# Patient Record
Sex: Female | Born: 1990 | Race: White | Hispanic: No | Marital: Single | Smoking: Current every day smoker
Health system: Southern US, Community
[De-identification: ages and names within clinical notes are randomized; demographics above are authoritative.]

---

## 2011-06-14 ENCOUNTER — Emergency Department (HOSPITAL_COMMUNITY)
Admission: EM | Admit: 2011-06-14 | Discharge: 2011-06-14 | Disposition: A | Discharge status: Home / Self Care | Payer: BC Managed Care – PPO | Attending: Emergency Medicine | Admitting: Emergency Medicine

## 2011-06-14 ENCOUNTER — Emergency Department (HOSPITAL_COMMUNITY): Payer: BC Managed Care – PPO

## 2011-06-14 DIAGNOSIS — R1031 Right lower quadrant pain: Secondary | ICD-10-CM | POA: Insufficient documentation

## 2011-06-14 DIAGNOSIS — N949 Unspecified condition associated with female genital organs and menstrual cycle: Secondary | ICD-10-CM | POA: Insufficient documentation

## 2011-06-14 DIAGNOSIS — R10813 Right lower quadrant abdominal tenderness: Secondary | ICD-10-CM | POA: Insufficient documentation

## 2011-06-14 LAB — URINALYSIS, ROUTINE W REFLEX MICROSCOPIC
Nitrite: NEGATIVE
Specific Gravity, Urine: 1.019 (ref 1.005–1.030)
pH: 7 (ref 5.0–8.0)

## 2011-06-14 LAB — CBC
HCT: 44.2 % (ref 36.0–46.0)
MCH: 30.6 pg (ref 26.0–34.0)
MCHC: 35.1 g/dL (ref 30.0–36.0)
RDW: 14.2 % (ref 11.5–15.5)

## 2011-06-14 LAB — DIFFERENTIAL
Basophils Absolute: 0.1 10*3/uL (ref 0.0–0.1)
Basophils Relative: 1 % (ref 0–1)
Eosinophils Relative: 3 % (ref 0–5)
Monocytes Absolute: 0.7 10*3/uL (ref 0.1–1.0)

## 2011-06-14 LAB — URINE MICROSCOPIC-ADD ON

## 2011-06-14 LAB — POCT PREGNANCY, URINE: Preg Test, Ur: NEGATIVE

## 2011-06-14 LAB — POCT I-STAT, CHEM 8
Creatinine, Ser: 1 mg/dL (ref 0.50–1.10)
Glucose, Bld: 74 mg/dL (ref 70–99)
Hemoglobin: 16 g/dL — ABNORMAL HIGH (ref 12.0–15.0)
TCO2: 23 mmol/L (ref 0–100)

## 2011-06-14 LAB — PREGNANCY, URINE: Preg Test, Ur: NEGATIVE

## 2011-06-14 LAB — WET PREP, GENITAL: Yeast Wet Prep HPF POC: NONE SEEN

## 2011-06-15 LAB — URINE CULTURE: Culture  Setup Time: 201210052012

## 2011-06-15 LAB — GC/CHLAMYDIA PROBE AMP, GENITAL: GC Probe Amp, Genital: NEGATIVE

## 2013-04-16 ENCOUNTER — Encounter: Payer: Self-pay | Admitting: Emergency Medicine

## 2013-04-16 ENCOUNTER — Emergency Department (INDEPENDENT_AMBULATORY_CARE_PROVIDER_SITE_OTHER)
Admission: EM | Admit: 2013-04-16 | Discharge: 2013-04-16 | Disposition: A | Discharge status: Home / Self Care | Payer: Managed Care, Other (non HMO) | Source: Home / Self Care | Attending: Family Medicine | Admitting: Family Medicine

## 2013-04-16 DIAGNOSIS — R21 Rash and other nonspecific skin eruption: Secondary | ICD-10-CM

## 2013-04-16 LAB — POCT CBC W AUTO DIFF (K'VILLE URGENT CARE)

## 2013-04-16 LAB — ANTISTREPTOLYSIN O TITER: ASO: 145 IU/mL (ref <409)

## 2013-04-16 MED ORDER — PREDNISONE 20 MG PO TABS: 20.0000 mg | ORAL_TABLET | Freq: Two times a day (BID) | ORAL | Status: DC

## 2013-04-16 NOTE — ED Notes (Signed)
Red itchy rash x 1 month.  She did change laundry detergent, but has since changed to a hypoallergenic laundry detergent with no relief

## 2013-04-16 NOTE — ED Provider Notes (Signed)
CSN: [id number]     Arrival date & time 04/16/13  0912 History     First MD Initiated Contact with Patient 04/16/13 [phone]     Chief Complaint  Patient presents with  . Rash     HPI Comments: Patient complains of onset of a localized rash on her left arm about a month ago, followed by gradual development of rash on other extremities and trunk.  The rash is pruritic.  She feels well otherwise.  No fevers, chills, and sweats.  No insect or tick bites.  She recalls that she had a brief cold-like illness just prior to onset of the rash Family history of eczema in her mother.  Patient is a 22 y.o. female presenting with rash. The history is provided by the patient.  Rash Pain location:  Generalized Pain quality comment:  Itching Pain severity:  Mild Onset quality:  Gradual Duration:  4 weeks Timing:  Constant Progression:  Unchanged Chronicity:  New Relieved by:  Nothing Exacerbated by: showers. Ineffective treatments: Benadryl. Associated symptoms: no anorexia, no chills, no cough, no diarrhea, no fatigue, no fever, no nausea, no shortness of breath, no sore throat and no vomiting     History reviewed. No pertinent past medical history. History reviewed. No pertinent past surgical history. No family history on file. History  Substance Use Topics  . Smoking status: Current Every Day Smoker -- 0.50 packs/day for 5 years  . Smokeless tobacco: Not on file  . Alcohol Use: Yes   OB History   Grav Para Term Preterm Abortions TAB SAB Ect Mult Living                 Review of Systems  Constitutional: Negative for fever, chills and fatigue.  HENT: Negative for sore throat.   Respiratory: Negative for cough and shortness of breath.   Gastrointestinal: Negative for nausea, vomiting, diarrhea and anorexia.  Skin: Positive for rash.  All other systems reviewed and are negative.    Allergies  Review of patient's allergies indicates no known allergies.  Home Medications   Current  Outpatient Rx  Name  Route  Sig  Dispense  Refill  . predniSONE (DELTASONE) 20 MG tablet   Oral   Take 1 tablet (20 mg total) by mouth 2 (two) times daily.   10 tablet   0    BP 114/79  Pulse 70  Temp(Src) 97.8 F (36.6 C) (Oral)  Ht 5\' 2"  (1.575 m)  Wt 128 lb (58.06 kg)  BMI 23.41 kg/m2  SpO2 100%  LMP 04/05/2013 Physical Exam Nursing notes and Vital Signs reviewed. Appearance:  Patient appears healthy, stated age, and in no acute distress Eyes:  Pupils are equal, round, and reactive to light and accomodation.  Extraocular movement is intact.  Conjunctivae are not inflamed  Ears:  Canals normal.  Tympanic membranes normal.  Nose:  Normal turbinates.  No sinus tenderness.   Mouth:  No lesions Pharynx:  Normal Neck:  Supple.   No adenopathy Lungs:  Clear to auscultation.  Breath sounds are equal.  Heart:  Regular rate and rhythm without murmurs, rubs, or gallops.  Abdomen:  Nontender without masses or hepatosplenomegaly.  Bowel sounds are present.  No CVA or flank tenderness.  Extremities:  No edema.  No calf tenderness Skin:  Widely scattered small oval shaped erythematous macuopapular lesions (3 to 8mm dia), many following skin cleavage lines.  New lesions have a fine collar of scale.  Older and resolving lesions show slight  hyperpigmentation.  No definite herald patch.  No lesions on palms or plantar surfaces, and none on face/scalp.  ED Course   Procedures  none    1. Rash and nonspecific skin eruption; differential diagnosis includes atypical pityriasis rosea, nummular eczema, guttate psoriasis.     MDM  Prednisone Burst.  Will check ASO titer. Minimize baths/showers, and bathe in cool water.  Use a mild bath soap containing oil such as unscented Dove.  Apply a moisturizing cream or lotion immediately after bathing while still wet, then towel dry.  Try non-sedating antihistamine such as Allegra, Claritin, or Zyrtec for itching. Followup with dermatologist if not  improving two weeks.  Lattie Haw, MD 04/17/13 1139

## 2018-01-12 ENCOUNTER — Encounter

## 2018-01-12 ENCOUNTER — Ambulatory Visit: Payer: Managed Care, Other (non HMO) | Admitting: Nurse Practitioner

## 2018-01-26 ENCOUNTER — Other Ambulatory Visit: Payer: Self-pay

## 2018-01-26 ENCOUNTER — Emergency Department (HOSPITAL_COMMUNITY): Payer: PRIVATE HEALTH INSURANCE

## 2018-01-26 ENCOUNTER — Encounter (HOSPITAL_COMMUNITY): Payer: Self-pay | Admitting: Emergency Medicine

## 2018-01-26 ENCOUNTER — Emergency Department (HOSPITAL_COMMUNITY)
Admission: EM | Admit: 2018-01-26 | Discharge: 2018-01-26 | Disposition: A | Discharge status: Home / Self Care | Payer: PRIVATE HEALTH INSURANCE | Attending: Emergency Medicine | Admitting: Emergency Medicine

## 2018-01-26 DIAGNOSIS — M25512 Pain in left shoulder: Secondary | ICD-10-CM | POA: Insufficient documentation

## 2018-01-26 DIAGNOSIS — F17200 Nicotine dependence, unspecified, uncomplicated: Secondary | ICD-10-CM | POA: Insufficient documentation

## 2018-01-26 LAB — BASIC METABOLIC PANEL
Anion gap: 10 (ref 5–15)
BUN: 7 mg/dL (ref 6–20)
CALCIUM: 8.5 mg/dL — AB (ref 8.9–10.3)
CO2: 19 mmol/L — ABNORMAL LOW (ref 22–32)
CREATININE: 0.72 mg/dL (ref 0.44–1.00)
Chloride: 109 mmol/L (ref 101–111)
Glucose, Bld: 141 mg/dL — ABNORMAL HIGH (ref 65–99)
Potassium: 4.3 mmol/L (ref 3.5–5.1)
SODIUM: 138 mmol/L (ref 135–145)

## 2018-01-26 LAB — CBC
HCT: 39.9 % (ref 36.0–46.0)
Hemoglobin: 13.5 g/dL (ref 12.0–15.0)
MCH: 31.5 pg (ref 26.0–34.0)
MCHC: 33.8 g/dL (ref 30.0–36.0)
MCV: 93.2 fL (ref 78.0–100.0)
PLATELETS: 304 10*3/uL (ref 150–400)
RBC: 4.28 MIL/uL (ref 3.87–5.11)
RDW: 13.2 % (ref 11.5–15.5)
WBC: 10.1 10*3/uL (ref 4.0–10.5)

## 2018-01-26 LAB — I-STAT TROPONIN, ED: TROPONIN I, POC: 0 ng/mL (ref 0.00–0.08)

## 2018-01-26 LAB — I-STAT BETA HCG BLOOD, ED (MC, WL, AP ONLY)

## 2018-01-26 MED ORDER — PREDNISONE 10 MG PO TABS: 40.0000 mg | ORAL_TABLET | Freq: Every day | ORAL | 0 refills | Status: AC

## 2018-01-26 MED ORDER — METHOCARBAMOL 500 MG PO TABS: 500.0000 mg | ORAL_TABLET | Freq: Three times a day (TID) | ORAL | 0 refills | Status: AC | PRN

## 2018-01-26 NOTE — Discharge Instructions (Addendum)
Lab work, heart enzymes, chest x-ray, EKG all looked ok today.   I suspect your pain is from muscular or nerve cause.  Both are treated similarly.   We will treat your inflammation with the following medication regimen: Prednisone 40 mg daily x 3 days Methocarbamol (robaxin) 500 mg every 8 hours x 5 days Ibuprofen 600 mg + Acetaminophen 1000 mg every 8 hours for the next 5 days Heating pad as needed Over the counter lidocaine patches (salonpas) can be helpful  Avoid any exacerbating activities for the next 48 hours.  After 48 hours, start doing light back range of motion exercises and walking to avoid worsening back stiffness.   Return for chest pain or shortness of breath with exertion, fevers, swelling numbness or weakness to your extremities, rash.  Follow up with orthopedist for re-evaluation if symptoms do not improve in 3-5 days despite medications

## 2018-01-26 NOTE — ED Triage Notes (Signed)
Pt reports sudden, sharp, left arm and chest pain that awoke her from sleep. Pt states she has had some nausea, diaphoresis and sob, tearful in triage. Denies any known injuries.

## 2018-01-26 NOTE — ED Notes (Signed)
Got patient into a gown on the monitor patient is resting with call bell in reach 
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
## 2018-07-13 ENCOUNTER — Ambulatory Visit
Admission: RE | Admit: 2018-07-13 | Discharge: 2018-07-13 | Disposition: A | Discharge status: Home / Self Care | Payer: PRIVATE HEALTH INSURANCE | Source: Ambulatory Visit | Attending: Adult Health | Admitting: Adult Health

## 2018-07-13 ENCOUNTER — Ambulatory Visit: Payer: PRIVATE HEALTH INSURANCE | Admitting: Adult Health

## 2018-07-13 ENCOUNTER — Encounter: Payer: Self-pay | Admitting: Adult Health

## 2018-07-13 VITALS — BP 120/72 | HR 75 | Temp 98.6°F | Resp 16 | Ht 62.0 in | Wt 185.0 lb

## 2018-07-13 DIAGNOSIS — R42 Dizziness and giddiness: Secondary | ICD-10-CM | POA: Diagnosis not present

## 2018-07-13 DIAGNOSIS — S060X0A Concussion without loss of consciousness, initial encounter: Secondary | ICD-10-CM | POA: Diagnosis not present

## 2018-07-13 DIAGNOSIS — G44319 Acute post-traumatic headache, not intractable: Secondary | ICD-10-CM

## 2018-07-13 NOTE — Progress Notes (Signed)
[facility] [address] [place], [place] [address]  Internal MEDICINE  Office Visit Note  Patient Name: Meredith Cortez  [id number]  [id number]  Date of Service: 07/16/2018   Complaints/HPI Pt is here for establishment of PCP. Chief Complaint  Patient presents with  . Facial Pain    chopping wood yesterday and a piece came up and got hit in the face and since has has multiple symptoms.   . Dizziness  . Nausea   HPI Pt here for new patient visit.  She is a well appearing 27 yo female. She denies current medication or medical issues.  Today she is complaining of right sided headache, with dizziness and nausea intermittently.  She states while chopping wood last night, a piece flew up and hit her near her right temple.  She denies LOC, however it did make her stumble and she had to sit down to avoid passing out. She states her head began to hurt, and has not stopped since then.  She intermittently has nausea and dizziness.  She denies any changes in vision.  She reports she smokes 10 cigarettes a week, and uses a Jule intermittently.  She drinks alcohol socially, and denies illicit drug use.    Current Medication: No outpatient encounter medications on file as of 07/13/2018.   No facility-administered encounter medications on file as of 07/13/2018.     Surgical History: History reviewed. No pertinent surgical history.  Medical History: History reviewed. No pertinent past medical history.  Family History: Family History  Problem Relation Age of Onset  . Alcoholism Father     Social History   Socioeconomic History  . Marital status: Single    Spouse name: Not on file  . Number of children: Not on file  . Years of education: Not on file  . Highest education level: Not on file  Occupational History  . Not on file  Social Needs  . Financial resource strain: Not on file  . Food insecurity:    Worry: Not on file    Inability: Not on file  . Transportation needs:     Medical: Not on file    Non-medical: Not on file  Tobacco Use  . Smoking status: Current Every Day Smoker    Packs/day: 0.50    Years: 5.00    Pack years: 2.50    Types: Cigarettes, E-cigarettes  . Smokeless tobacco: Never Used  Substance and Sexual Activity  . Alcohol use: Yes    Comment: 2-3 x/week  . Drug use: Never  . Sexual activity: Not on file  Lifestyle  . Physical activity:    Days per week: Not on file    Minutes per session: Not on file  . Stress: Not on file  Relationships  . Social connections:    Talks on phone: Not on file    Gets together: Not on file    Attends religious service: Not on file    Active member of club or organization: Not on file    Attends meetings of clubs or organizations: Not on file    Relationship status: Not on file  . Intimate partner violence:    Fear of current or ex partner: Not on file    Emotionally abused: Not on file    Physically abused: Not on file    Forced sexual activity: Not on file  Other Topics Concern  . Not on file  Social History Narrative  . Not on file  Review of Systems  Constitutional: Positive for activity change and appetite change. Negative for chills, fatigue and unexpected weight change.  HENT: Negative for congestion, rhinorrhea, sneezing and sore throat.   Eyes: Negative for photophobia, pain and redness.  Respiratory: Negative for cough, chest tightness and shortness of breath.   Cardiovascular: Negative for chest pain and palpitations.  Gastrointestinal: Negative for abdominal pain, constipation, diarrhea, nausea and vomiting.  Endocrine: Negative.   Genitourinary: Negative for dysuria and frequency.  Musculoskeletal: Negative for arthralgias, back pain, joint swelling and neck pain.  Skin: Negative for rash.  Allergic/Immunologic: Negative.   Neurological: Positive for dizziness and headaches. Negative for tremors and numbness.  Hematological: Negative for adenopathy. Does not  bruise/bleed easily.  Psychiatric/Behavioral: Negative for behavioral problems and sleep disturbance. The patient is not nervous/anxious.     Vital Signs: BP 120/72 (BP Location: Left Arm, Patient Position: Sitting, Cuff Size: Normal)   Pulse 75   Temp 98.6 F (37 C) (Oral)   Resp 16   Ht 5\' 2"  (1.575 m)   Wt 185 lb (83.9 kg)   SpO2 99%   BMI 33.84 kg/m    Physical Exam  Constitutional: She is oriented to person, place, and time. She appears well-developed and well-nourished. No distress.  HENT:  Head: Normocephalic and atraumatic.  Mouth/Throat: Oropharynx is clear and moist. No oropharyngeal exudate.  Eyes: Pupils are equal, round, and reactive to light. EOM are normal.  Neck: Normal range of motion. Neck supple. No JVD present. No tracheal deviation present. No thyromegaly present.  Cardiovascular: Normal rate, regular rhythm and normal heart sounds. Exam reveals no gallop and no friction rub.  No murmur heard. Pulmonary/Chest: Effort normal and breath sounds normal. No respiratory distress. She has no wheezes. She has no rales. She exhibits no tenderness.  Abdominal: Soft. There is no tenderness. There is no guarding.  Musculoskeletal: Normal range of motion.  Lymphadenopathy:    She has no cervical adenopathy.  Neurological: She is alert and oriented to person, place, and time. No cranial nerve deficit.  Skin: Skin is warm and dry. She is not diaphoretic.  Psychiatric: She has a normal mood and affect. Her behavior is normal. Judgment and thought content normal.  Nursing note and vitals reviewed.   Assessment/Plan: 1. Concussion without loss of consciousness, initial encounter Patient likely has concussion due to hitting herself in the head with a piece of wood while chopping wood.  Encourage patient to rest avoid screen time.  Take ibuprofen or Tylenol around-the-clock to stay ahead of headache symptoms.  Stay out of work for the next 2 days.  2. Acute post-traumatic  headache, not intractable Head CT is negative patient has no overt hematoma fracture or trauma.  Most likely just concussion and will follow with symptoms of it. - CT Head Wo Contrast; Future  3. Dizziness Dizziness will likely improve with rest and avoiding screen time.  If dizziness does not improve in the next 4 hours return to clinic or go to emergency room.  General Counseling: Janaiyah verbalizes understanding of the findings of todays visit and agrees with plan of treatment. I have discussed any further diagnostic evaluation that may be needed or ordered today. We also reviewed her medications today. she has been encouraged to call the office with any questions or concerns that should arise related to todays visit.  Orders Placed This Encounter  Procedures  . CT Head Wo Contrast    No orders of the defined types were placed in  this encounter.   Time spent: 25 Minutes   This patient was seen by Blima Ledger AGNP-C in Collaboration with Dr Lyndon Code as a part of collaborative care agreement  Johnna Acosta AGNP-C Internal Medicine

## 2018-08-11 ENCOUNTER — Ambulatory Visit: Payer: Self-pay | Admitting: Adult Health

## 2018-08-19 ENCOUNTER — Encounter (HOSPITAL_COMMUNITY): Payer: Self-pay | Admitting: *Deleted

## 2018-08-19 ENCOUNTER — Emergency Department (HOSPITAL_COMMUNITY): Payer: Self-pay

## 2018-08-19 ENCOUNTER — Emergency Department (HOSPITAL_COMMUNITY)
Admission: EM | Admit: 2018-08-19 | Discharge: 2018-08-19 | Disposition: A | Discharge status: Home / Self Care | Payer: Self-pay | Attending: Emergency Medicine | Admitting: Emergency Medicine

## 2018-08-19 DIAGNOSIS — Y93E1 Activity, personal bathing and showering: Secondary | ICD-10-CM | POA: Insufficient documentation

## 2018-08-19 DIAGNOSIS — W182XXA Fall in (into) shower or empty bathtub, initial encounter: Secondary | ICD-10-CM | POA: Insufficient documentation

## 2018-08-19 DIAGNOSIS — Y999 Unspecified external cause status: Secondary | ICD-10-CM | POA: Insufficient documentation

## 2018-08-19 DIAGNOSIS — Y92002 Bathroom of unspecified non-institutional (private) residence single-family (private) house as the place of occurrence of the external cause: Secondary | ICD-10-CM | POA: Insufficient documentation

## 2018-08-19 DIAGNOSIS — W19XXXA Unspecified fall, initial encounter: Secondary | ICD-10-CM

## 2018-08-19 DIAGNOSIS — F17210 Nicotine dependence, cigarettes, uncomplicated: Secondary | ICD-10-CM | POA: Insufficient documentation

## 2018-08-19 DIAGNOSIS — S2232XA Fracture of one rib, left side, initial encounter for closed fracture: Secondary | ICD-10-CM | POA: Insufficient documentation

## 2018-08-19 DIAGNOSIS — R0602 Shortness of breath: Secondary | ICD-10-CM | POA: Insufficient documentation

## 2018-08-19 MED ORDER — OXYCODONE-ACETAMINOPHEN 5-325 MG PO TABS: 1.0000 | ORAL_TABLET | Freq: Four times a day (QID) | ORAL | 0 refills | Status: DC | PRN

## 2018-08-19 MED ORDER — OXYCODONE-ACETAMINOPHEN 5-325 MG PO TABS
1.0000 | ORAL_TABLET | Freq: Once | ORAL | Status: AC
  Administered 2018-08-19: 1 via ORAL
  Filled 2018-08-19: qty 1

## 2018-08-19 NOTE — Discharge Instructions (Addendum)
Please read attached information. If you experience any new or worsening signs or symptoms please return to the emergency room for evaluation. Please follow-up with your primary care provider or specialist as discussed. Please use medication prescribed only as directed and discontinue taking if you have any concerning signs or symptoms.   °

## 2018-08-19 NOTE — ED Triage Notes (Signed)
Pt in after fall last night in her shower, landed on her left side, c/o left rib pain, worse with movement

## 2018-08-19 NOTE — ED Provider Notes (Signed)
[facility] EMERGENCY DEPARTMENT Provider Note   CSN: [id number] Arrival date & time: 08/19/18  1017     History   Chief Complaint Chief Complaint  Patient presents with  . Fall    HPI Meredith Cortez is a 27 y.o. female.  HPI   27 year old female presents status post fall.  Patient notes she was in the shower yesterday when she slipped landing on the edge of the shower.  She notes striking the left lateral ribs.  Since that time she has had pain, pain with inspiration or movement.  Patient denies any fever, she notes shortness of breath with deep inspiration, none at rest.  No history of the same, denies any abdominal pain or other signs of trauma.  Patient denies any chronic health conditions, notes she is a smoker.  History reviewed. No pertinent past medical history.  There are no active problems to display for this patient.   History reviewed. No pertinent surgical history.   OB History   None      Home Medications    Prior to Admission medications   Medication Sig Start Date End Date Taking? Authorizing Provider  oxyCODONE-acetaminophen (PERCOCET/ROXICET) 5-325 MG tablet Take 1 tablet by mouth every 6 (six) hours as needed for severe pain. 08/19/18   Eyvonne Mechanic, PA-C    Family History Family History  Problem Relation Age of Onset  . Alcoholism Father     Social History Social History   Tobacco Use  . Smoking status: Current Every Day Smoker    Packs/day: 0.50    Years: 5.00    Pack years: 2.50    Types: Cigarettes, E-cigarettes  . Smokeless tobacco: Never Used  Substance Use Topics  . Alcohol use: Yes    Comment: 2-3 x/week  . Drug use: Never     Allergies   Patient has no known allergies.   Review of Systems Review of Systems  All other systems reviewed and are negative.    Physical Exam Updated Vital Signs BP 104/70 (BP Location: Right Arm)   Pulse 91   Temp 97.6 F (36.4 C) (Oral)   Resp 20   LMP  07/29/2018   SpO2 100%   Physical Exam  Constitutional: She is oriented to person, place, and time. She appears well-developed and well-nourished.  HENT:  Head: Normocephalic and atraumatic.  Eyes: Pupils are equal, round, and reactive to light. Conjunctivae are normal. Right eye exhibits no discharge. Left eye exhibits no discharge. No scleral icterus.  Neck: Normal range of motion. No JVD present. No tracheal deviation present.  Pulmonary/Chest: Effort normal. No stridor.  Chest and back atraumatic exquisite tenderness palpation of left lateral ribs with attention to the posterior chest wall-no midline cervical thoracic or lumbar spinal tenderness to palpation-lung expansion decreased secondary to pain, lung sounds clear  Abdominal:  Abdomen soft nontender, no bruising  Neurological: She is alert and oriented to person, place, and time. Coordination normal.  Psychiatric: She has a normal mood and affect. Her behavior is normal. Judgment and thought content normal.  Nursing note and vitals reviewed.    ED Treatments / Results  Labs (all labs ordered are listed, but only abnormal results are displayed) Labs Reviewed - No data to display  EKG None  Radiology Dg Ribs Unilateral W/chest Left  Result Date: 08/19/2018 CLINICAL DATA:  Left-sided chest pain and shortness of breath after fall today. EXAM: LEFT RIBS AND CHEST - 3+ VIEW COMPARISON:  Radiographs of Jan 26, 2018.  FINDINGS: Minimally displaced fracture is seen involving posterior portion of left tenth rib. There is no evidence of pneumothorax or pleural effusion. Both lungs are clear. Heart size and mediastinal contours are within normal limits. IMPRESSION: Minimally displaced left tenth rib fracture. No acute cardiopulmonary abnormality seen. Electronically Signed   By: Lupita RaiderJames  Green Jr, M.D.   On: 08/19/2018 10:50    Procedures Procedures (including critical care time)  Medications Ordered in ED Medications    oxyCODONE-acetaminophen (PERCOCET/ROXICET) 5-325 MG per tablet 1 tablet (has no administration in time range)     Initial Impression / Assessment and Plan / ED Course  I have reviewed the triage vital signs and the nursing notes.  Pertinent labs & imaging results that were available during my care of the patient were reviewed by me and considered in my medical decision making (see chart for details).     Labs:   Imaging:  Consults:  Therapeutics:  Discharge Meds:   Assessment/Plan: 27 year old female with isolated left 10th rib fracture.  No signs of respiratory distress, she is young healthy with no significant past medical history.  Patient given incentive spirometer, pain medication discharged with outpatient follow-up and strict return precautions.  She verbalized understanding and agreement to today's plan had no further questions or concerns.      Final Clinical Impressions(s) / ED Diagnoses   Final diagnoses:  Fall, initial encounter  Closed fracture of one rib of left side, initial encounter    ED Discharge Orders         Ordered    oxyCODONE-acetaminophen (PERCOCET/ROXICET) 5-325 MG tablet  Every 6 hours PRN     08/19/18 1115           Eyvonne MechanicHedges, Joshuan Bolander, PA-C 08/19/18 1116    Arby BarrettePfeiffer, Marcy, MD 08/19/18 1401

## 2019-04-09 ENCOUNTER — Ambulatory Visit: Payer: BC Managed Care – PPO | Admitting: Adult Health

## 2019-04-09 ENCOUNTER — Encounter: Payer: Self-pay | Admitting: Adult Health

## 2019-04-09 ENCOUNTER — Other Ambulatory Visit: Payer: Self-pay

## 2019-04-09 VITALS — Temp 97.6°F

## 2019-04-09 DIAGNOSIS — R1115 Cyclical vomiting syndrome unrelated to migraine: Secondary | ICD-10-CM

## 2019-04-09 DIAGNOSIS — R140 Abdominal distension (gaseous): Secondary | ICD-10-CM

## 2019-04-09 NOTE — Progress Notes (Signed)
[facility] [place], [place] [address]  Internal MEDICINE  Office Visit Note  Patient Name: Meredith Cortez  [id number]  [id number]  Date of Service: 04/09/2019  Chief Complaint  Patient presents with  . Telephone Screen  . Abdominal Pain    abdominal pain for years, after going to restroom having pain, nausea and vomitting, in the last month and half lost 8  pounds   . Telephone Assessment     HPI Pt is here for a sick visit. Pt reports she has been having abdominal pain for the better part of 2 years.  She has always dealt with it.  It is mostly in the morning.  She typically wakes up, has a bowel movement and then her pain begins.  She will then be nauseated and vomiting for about an hour. The nausea will last a few hours after the vomiting stops.  She was treated for probably diverticulitis recently, and states the symptoms improved some while on flagyl, however did not go away completely. Over the last week she reports the symptoms have increased exponentially. She reports she has drastically changed her diet, and has been eating bland foods, and healthy foods.  She reports she vomited whole food the day after, she consumed it.  She reports an 8 pound weight loss in the last month and a half. She reports having 4-5 bowel movements a day that are at times diarrhea.           Current Medication:  Outpatient Encounter Medications as of 04/09/2019  Medication Sig  . [DISCONTINUED] oxyCODONE-acetaminophen (PERCOCET/ROXICET) 5-325 MG tablet Take 1 tablet by mouth every 6 (six) hours as needed for severe pain. (Patient not taking: Reported on 04/09/2019)   No facility-administered encounter medications on file as of 04/09/2019.       Medical History: History reviewed. No pertinent past medical history.   Vital Signs: Temp 97.6 F (36.4 C)    Review of Systems  Constitutional: Negative for chills, fatigue and unexpected weight change.  HENT: Negative  for congestion, rhinorrhea, sneezing and sore throat.   Eyes: Negative for photophobia, pain and redness.  Respiratory: Negative for cough, chest tightness and shortness of breath.   Cardiovascular: Negative for chest pain and palpitations.  Gastrointestinal: Negative for abdominal pain, constipation, diarrhea, nausea and vomiting.  Endocrine: Negative.   Genitourinary: Negative for dysuria and frequency.  Musculoskeletal: Negative for arthralgias, back pain, joint swelling and neck pain.  Skin: Negative for rash.  Allergic/Immunologic: Negative.   Neurological: Negative for tremors and numbness.  Hematological: Negative for adenopathy. Does not bruise/bleed easily.  Psychiatric/Behavioral: Negative for behavioral problems and sleep disturbance. The patient is not nervous/anxious.     Physical Exam Vitals signs and nursing note reviewed.  Constitutional:      General: She is not in acute distress.    Appearance: She is well-developed. She is not diaphoretic.  HENT:     Head: Normocephalic and atraumatic.     Mouth/Throat:     Pharynx: No oropharyngeal exudate.  Eyes:     Pupils: Pupils are equal, round, and reactive to light.  Neck:     Musculoskeletal: Normal range of motion and neck supple.     Thyroid: No thyromegaly.     Vascular: No JVD.     Trachea: No tracheal deviation.  Cardiovascular:     Rate and Rhythm: Normal rate and regular rhythm.     Heart sounds: Normal heart sounds. No murmur. No friction rub.  No gallop.   Pulmonary:     Effort: Pulmonary effort is normal. No respiratory distress.     Breath sounds: Normal breath sounds. No wheezing or rales.  Chest:     Chest wall: No tenderness.  Abdominal:     Palpations: Abdomen is soft.     Tenderness: There is no abdominal tenderness. There is no guarding.  Musculoskeletal: Normal range of motion.  Lymphadenopathy:     Cervical: No cervical adenopathy.  Skin:    General: Skin is warm and dry.  Neurological:      Mental Status: She is alert and oriented to person, place, and time.     Cranial Nerves: No cranial nerve deficit.  Psychiatric:        Behavior: Behavior normal.        Thought Content: Thought content normal.        Judgment: Judgment normal.     Assessment/Plan: 1. Abdominal distension (gaseous) Will get CT of abdomen, as patients symptoms are becoming more severe.  - CT Abdomen Pelvis W Contrast; Future  2. Cyclic vomiting syndrome Will treat with reglan, and get CT to rule out organic cause.   General Counseling: Diannia verbalizes understanding of the findings of todays visit and agrees with plan of treatment. I have discussed any further diagnostic evaluation that may be needed or ordered today. We also reviewed her medications today. she has been encouraged to call the office with any questions or concerns that should arise related to todays visit.   No orders of the defined types were placed in this encounter.   No orders of the defined types were placed in this encounter.   Time spent: 15 Minutes  This patient was seen by Blima LedgerAdam Bronsyn Shappell AGNP-C in Collaboration with Dr Lyndon CodeFozia M Khan as a part of collaborative care agreement.  Johnna AcostaAdam J. Lasonia Casino AGNP-C Internal Medicine

## 2019-04-14 ENCOUNTER — Other Ambulatory Visit: Payer: Self-pay

## 2019-04-14 ENCOUNTER — Ambulatory Visit (HOSPITAL_COMMUNITY)
Admission: RE | Admit: 2019-04-14 | Discharge: 2019-04-14 | Disposition: A | Discharge status: Home / Self Care | Payer: BC Managed Care – PPO | Source: Ambulatory Visit | Attending: Adult Health | Admitting: Adult Health

## 2019-04-14 DIAGNOSIS — R140 Abdominal distension (gaseous): Secondary | ICD-10-CM | POA: Insufficient documentation

## 2019-04-14 MED ORDER — IOHEXOL 300 MG/ML  SOLN
100.0000 mL | Freq: Once | INTRAMUSCULAR | Status: AC | PRN
  Administered 2019-04-14: 16:00:00 100 mL via INTRAVENOUS

## 2019-04-15 ENCOUNTER — Encounter: Payer: Self-pay | Admitting: Gastroenterology

## 2019-04-15 ENCOUNTER — Other Ambulatory Visit: Payer: Self-pay | Admitting: Adult Health

## 2019-04-15 ENCOUNTER — Encounter: Payer: Self-pay | Admitting: Adult Health

## 2019-04-15 DIAGNOSIS — R1115 Cyclical vomiting syndrome unrelated to migraine: Secondary | ICD-10-CM

## 2019-04-15 MED ORDER — METOCLOPRAMIDE HCL 10 MG/10ML PO SOLN: 5.0000 mg | Freq: Three times a day (TID) | ORAL | 0 refills | Status: DC

## 2019-04-15 NOTE — Progress Notes (Signed)
Sent referral for GI, and sent RX for reglan to pharmacy.

## 2019-04-16 ENCOUNTER — Encounter: Payer: Self-pay | Admitting: Adult Health

## 2019-05-09 ENCOUNTER — Other Ambulatory Visit: Payer: Self-pay | Admitting: Adult Health

## 2019-05-09 MED ORDER — METOCLOPRAMIDE HCL 10 MG/10ML PO SOLN: 5.0000 mg | Freq: Three times a day (TID) | ORAL | 0 refills | Status: DC

## 2019-05-09 NOTE — Progress Notes (Signed)
Refilled patients Reglan, she has CT scan scheduled for sept 8th.

## 2019-05-18 ENCOUNTER — Ambulatory Visit: Payer: BC Managed Care – PPO | Admitting: Gastroenterology

## 2019-05-28 ENCOUNTER — Ambulatory Visit: Payer: BC Managed Care – PPO | Admitting: Gastroenterology

## 2019-06-22 ENCOUNTER — Other Ambulatory Visit: Payer: Self-pay | Admitting: Adult Health

## 2019-10-12 ENCOUNTER — Ambulatory Visit: Payer: Self-pay | Attending: Internal Medicine

## 2019-10-12 DIAGNOSIS — Z20822 Contact with and (suspected) exposure to covid-19: Secondary | ICD-10-CM | POA: Insufficient documentation

## 2019-10-13 LAB — NOVEL CORONAVIRUS, NAA: SARS-CoV-2, NAA: NOT DETECTED

## 2019-12-29 ENCOUNTER — Other Ambulatory Visit: Payer: Self-pay | Admitting: Adult Health

## 2019-12-29 MED ORDER — TRIAMCINOLONE ACETONIDE 0.1 % EX CREA: 1.0000 "application " | TOPICAL_CREAM | Freq: Two times a day (BID) | CUTANEOUS | 0 refills | Status: DC

## 2019-12-29 MED ORDER — PREDNISONE 10 MG PO TABS: ORAL_TABLET | ORAL | 0 refills | Status: DC

## 2019-12-29 NOTE — Progress Notes (Signed)
Patient has rash, sent kenalog and triamcinolone cream to pharmacy

## 2020-01-25 ENCOUNTER — Emergency Department (HOSPITAL_BASED_OUTPATIENT_CLINIC_OR_DEPARTMENT_OTHER): Payer: Self-pay

## 2020-01-25 ENCOUNTER — Encounter (HOSPITAL_BASED_OUTPATIENT_CLINIC_OR_DEPARTMENT_OTHER): Payer: Self-pay | Admitting: *Deleted

## 2020-01-25 ENCOUNTER — Emergency Department (HOSPITAL_BASED_OUTPATIENT_CLINIC_OR_DEPARTMENT_OTHER)
Admission: EM | Admit: 2020-01-25 | Discharge: 2020-01-25 | Disposition: A | Discharge status: Home / Self Care | Payer: Self-pay | Attending: Emergency Medicine | Admitting: Emergency Medicine

## 2020-01-25 ENCOUNTER — Other Ambulatory Visit: Payer: Self-pay

## 2020-01-25 DIAGNOSIS — Y9301 Activity, walking, marching and hiking: Secondary | ICD-10-CM | POA: Insufficient documentation

## 2020-01-25 DIAGNOSIS — Z79899 Other long term (current) drug therapy: Secondary | ICD-10-CM | POA: Insufficient documentation

## 2020-01-25 DIAGNOSIS — Y999 Unspecified external cause status: Secondary | ICD-10-CM | POA: Insufficient documentation

## 2020-01-25 DIAGNOSIS — F17210 Nicotine dependence, cigarettes, uncomplicated: Secondary | ICD-10-CM | POA: Insufficient documentation

## 2020-01-25 DIAGNOSIS — Y929 Unspecified place or not applicable: Secondary | ICD-10-CM | POA: Insufficient documentation

## 2020-01-25 DIAGNOSIS — W1842XA Slipping, tripping and stumbling without falling due to stepping into hole or opening, initial encounter: Secondary | ICD-10-CM | POA: Insufficient documentation

## 2020-01-25 DIAGNOSIS — S93401A Sprain of unspecified ligament of right ankle, initial encounter: Secondary | ICD-10-CM | POA: Insufficient documentation

## 2020-01-25 NOTE — Discharge Instructions (Signed)
Rest - please stay off ankle as much as possible until you can put weight on it without severe pain Ice - ice for 20 minutes at a time, several times a day Compression - wear brace to provide support Elevate - elevate ankle above level of heart Ibuprofen - take 600mg  with food. Take up to 3-4 times daily

## 2020-01-25 NOTE — ED Triage Notes (Signed)
She fell in a hole last night and twisted her right ankle.

## 2020-01-25 NOTE — ED Provider Notes (Signed)
[facility] EMERGENCY DEPARTMENT Provider Note   CSN: [id number] Arrival date & time: 01/25/20  1129     History Chief Complaint  Patient presents with  . Ankle Injury    Meredith Cortez is a 29 y.o. female who presents with a right ankle injury.  Patient states that she was in pain attention yesterday when she was walking and she stepped in a hole and twisted her ankle.  Since then she has been having pain over the lateral aspect of the ankle which is worse with weightbearing.  She is having difficulty walking secondary to pain.  She is been taking ibuprofen with mild relief.  She states that she played soccer in the past but never got checked out for any prior ankle injuries.  She denies weakness, numbness or tingling.  HPI     History reviewed. No pertinent past medical history.  There are no problems to display for this patient.   History reviewed. No pertinent surgical history.   OB History   No obstetric history on file.     Family History  Problem Relation Age of Onset  . Alcoholism Father     Social History   Tobacco Use  . Smoking status: Current Every Day Smoker    Packs/day: 0.50    Years: 5.00    Pack years: 2.50    Types: Cigarettes, E-cigarettes  . Smokeless tobacco: Never Used  Substance Use Topics  . Alcohol use: Yes    Comment: 2-3 x/week  . Drug use: Never    Home Medications Prior to Admission medications   Medication Sig Start Date End Date Taking? Authorizing Provider  metoCLOPramide (REGLAN) 10 MG/10ML SOLN Take 5 mLs (5 mg total) by mouth 3 (three) times daily before meals. 05/09/19   Johnna Acosta, NP  predniSONE (DELTASONE) 10 MG tablet Use per dose pack 12/29/19   Scarboro, Coralee North, NP  triamcinolone cream (KENALOG) 0.1 % Apply 1 application topically 2 (two) times daily. 12/29/19   Johnna Acosta, NP    Allergies    Patient has no known allergies.  Review of Systems   Review of Systems  Musculoskeletal: Positive for  arthralgias. Negative for joint swelling.  Skin: Negative for wound.  Neurological: Negative for weakness and numbness.    Physical Exam Updated Vital Signs BP 123/85 (BP Location: Right Arm)   Pulse 74   Temp 98.5 F (36.9 C) (Oral)   Resp 16   Ht 5\' 2"  (1.575 m)   Wt 76.2 kg   LMP 12/28/2019   SpO2 100%   BMI 30.73 kg/m   Physical Exam Vitals and nursing note reviewed.  Constitutional:      General: She is not in acute distress.    Appearance: She is well-developed.  HENT:     Head: Normocephalic and atraumatic.  Eyes:     General: No scleral icterus.       Right eye: No discharge.        Left eye: No discharge.     Conjunctiva/sclera: Conjunctivae normal.     Pupils: Pupils are equal, round, and reactive to light.  Cardiovascular:     Rate and Rhythm: Normal rate.  Pulmonary:     Effort: Pulmonary effort is normal. No respiratory distress.  Abdominal:     General: There is no distension.  Musculoskeletal:     Cervical back: Normal range of motion.     Comments: Right ankle: No obvious swelling, deformity, or warmth. Tenderness to  palpation over lateral ankle. Able to wiggle toes. No calf tenderness. Cap refill <2. N/V intact.   Skin:    General: Skin is warm and dry.  Neurological:     Mental Status: She is alert and oriented to person, place, and time.  Psychiatric:        Behavior: Behavior normal.     ED Results / Procedures / Treatments   Labs (all labs ordered are listed, but only abnormal results are displayed) Labs Reviewed - No data to display  EKG None  Radiology DG Ankle Complete Right  Result Date: 01/25/2020 CLINICAL DATA:  Pain following fall with twisting injury EXAM: RIGHT ANKLE - COMPLETE 3+ VIEW COMPARISON:  None. FINDINGS: Frontal, oblique, and lateral views were obtained. There is no acute fracture or joint effusion. A small focus of calcification anterior to the talus may represent residua of old trauma. No evident joint space  narrowing or erosion. There is a small inferior calcaneal spur. Ankle mortise appears intact. IMPRESSION: No acute fracture. Small focus of calcification dorsal to the talus may represent residua of old trauma. Ankle mortise appears intact. No joint space narrowing. Small inferior calcaneal spur noted. Electronically Signed   By: Lowella Grip III M.D.   On: 01/25/2020 12:10    Procedures Procedures (including critical care time)  Medications Ordered in ED Medications - No data to display  ED Course  I have reviewed the triage vital signs and the nursing notes.  Pertinent labs & imaging results that were available during my care of the patient were reviewed by me and considered in my medical decision making (see chart for details).  29 year old who presents with ankle injury after stepping in a hole. Exam is remarkable for tenderness but there is no significant swelling or deformity. Xrays are negative for acute bony pathology. She played soccer in the past and xray shows possible old injury of the talus. Will treat as ankle sprain. ASO brace and crutches given. RICE protocol discussed.  MDM Rules/Calculators/A&P                       Final Clinical Impression(s) / ED Diagnoses Final diagnoses:  Sprain of right ankle, unspecified ligament, initial encounter    Rx / DC Orders ED Discharge Orders    None       Recardo Evangelist, PA-C 01/25/20 1452    Varney Biles, MD 01/27/20 [phone]

## 2020-12-15 IMAGING — CR DG ANKLE COMPLETE 3+V*R*
3 series · 3 of 3 positions shown · non-contrast
Comparison: None.

CLINICAL DATA: Pain following fall with twisting injury

EXAM:
RIGHT ANKLE - COMPLETE 3+ VIEW

[t ankle joint ap right]
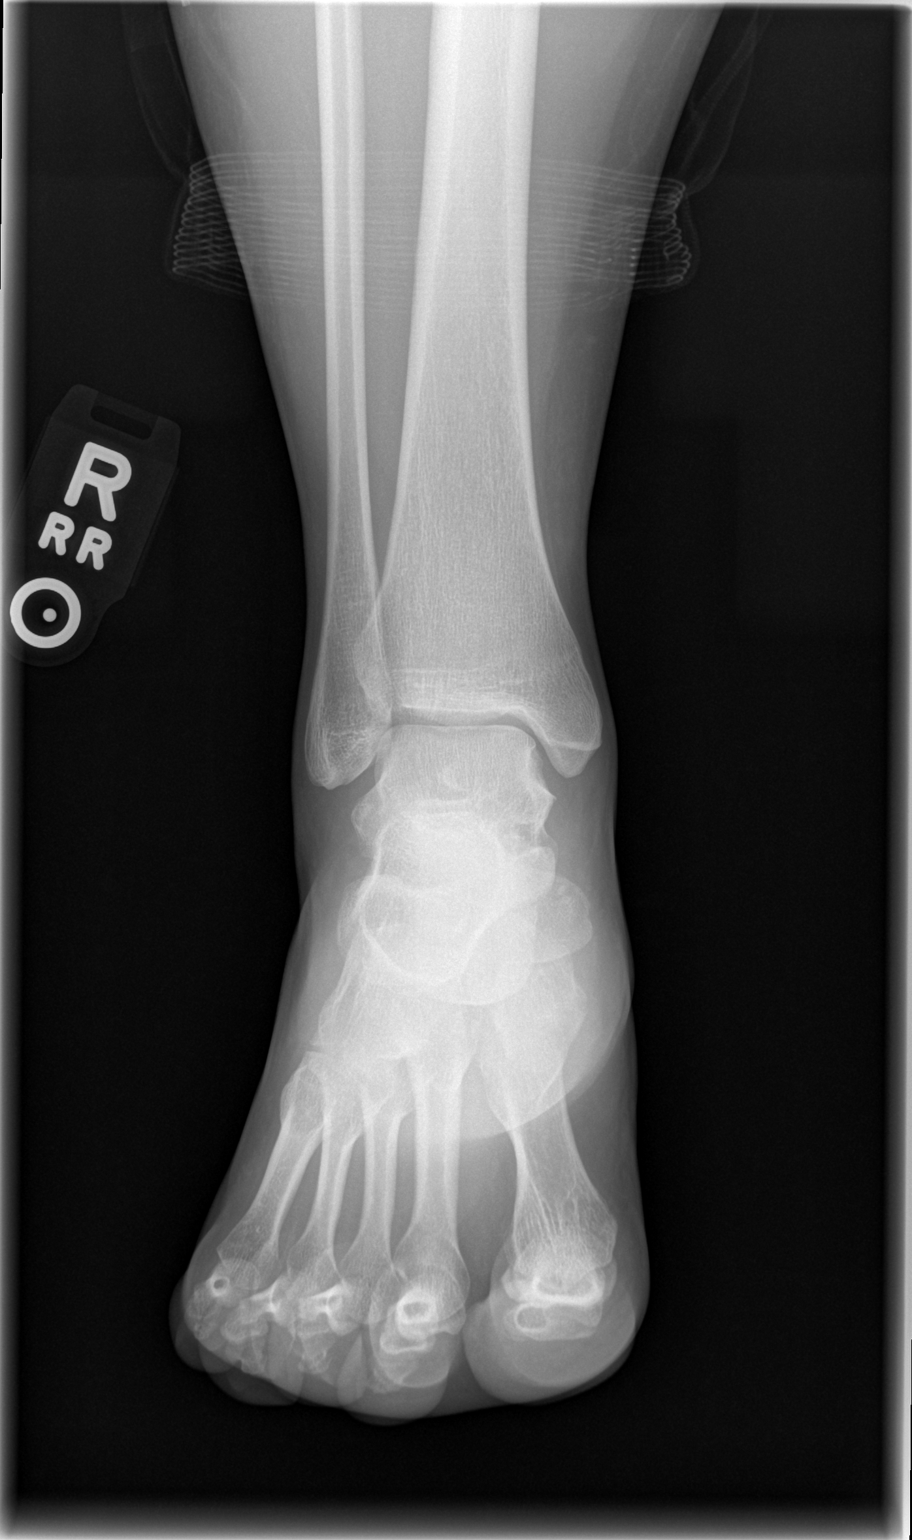

[t ankle joint oblique right]
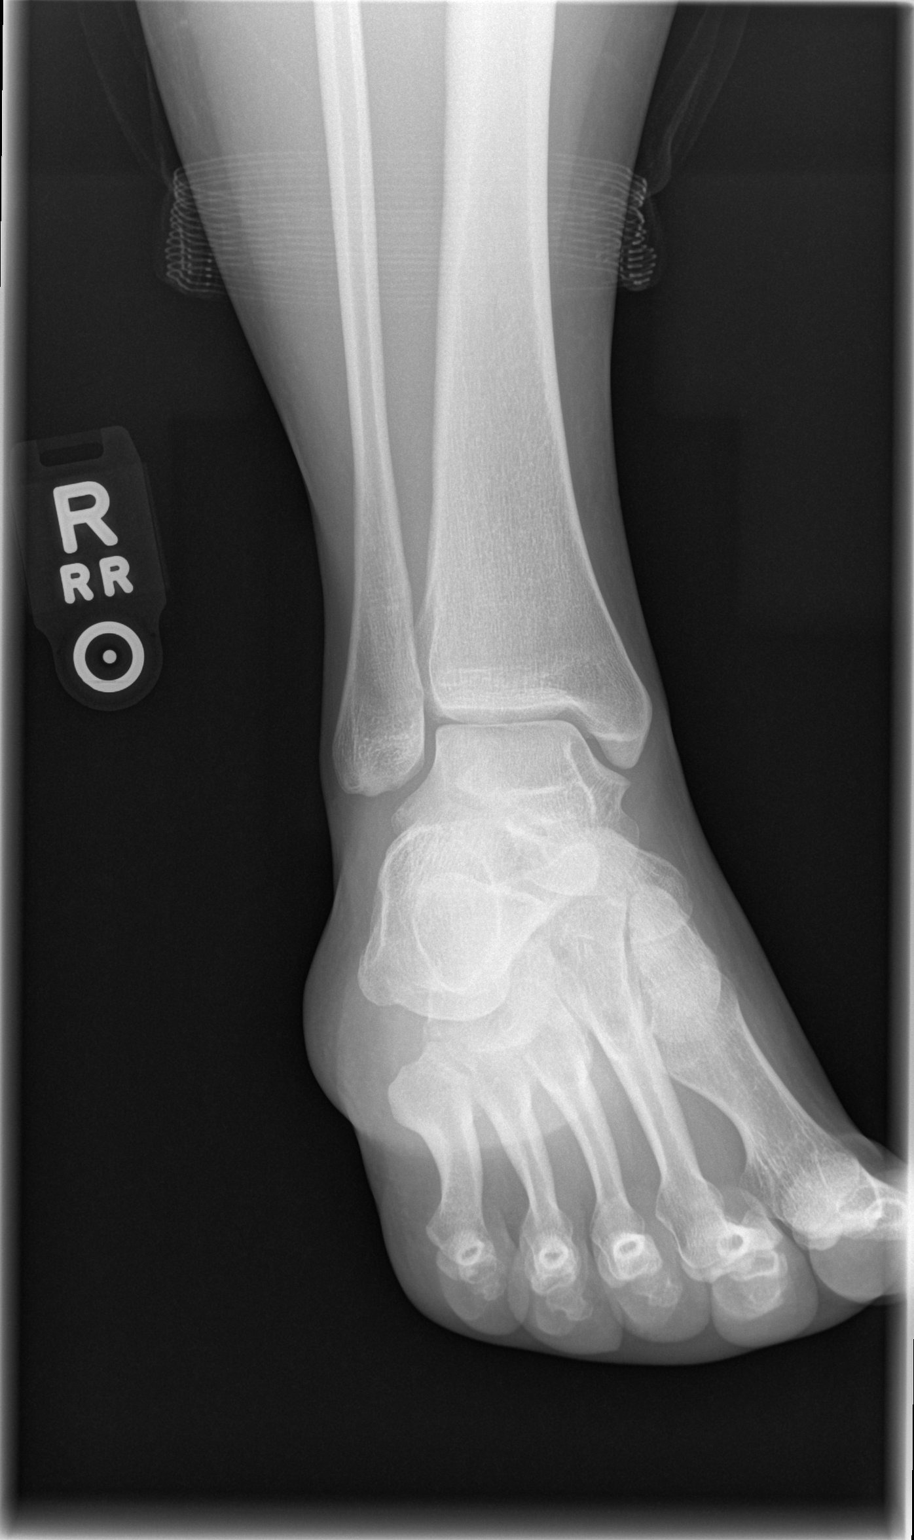

[t ankle joint lat right]
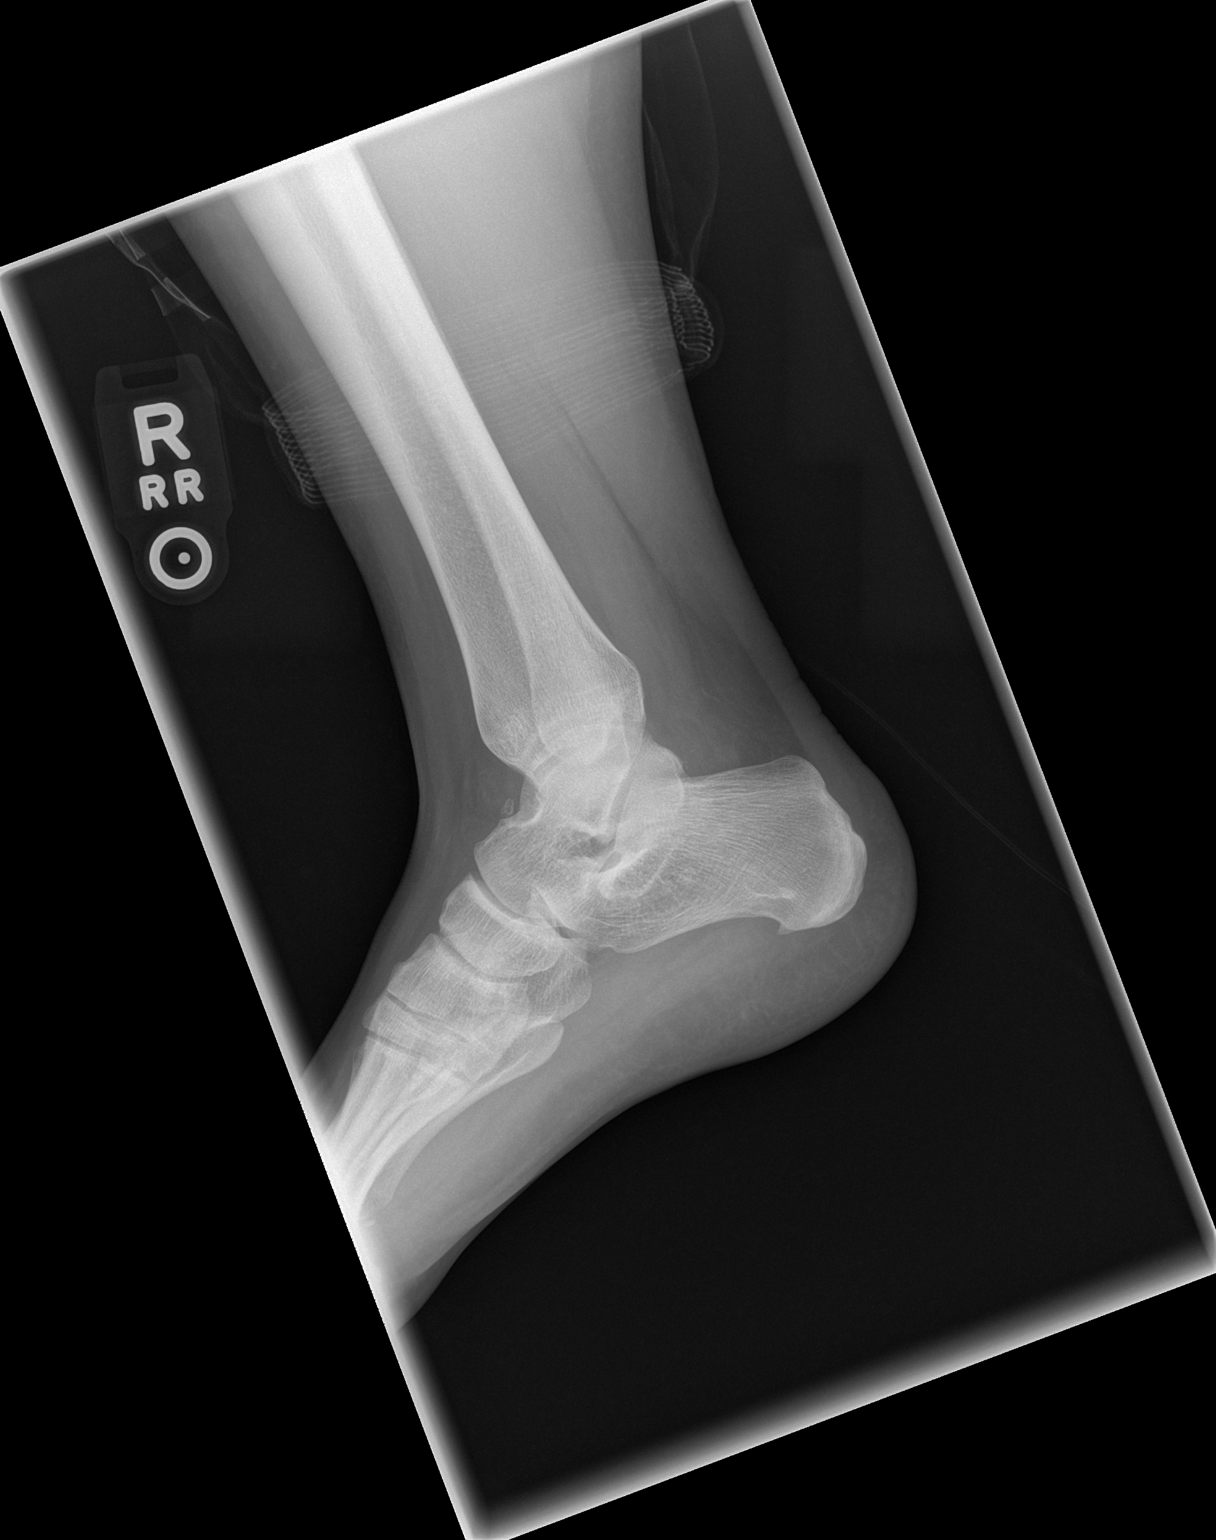

[3 of 3 positions shown; findings below may reference images not displayed]

FINDINGS: Frontal, oblique, and lateral views were obtained. There is no acute
fracture or joint effusion. A small focus of calcification anterior
to the talus may represent residua of old trauma. No evident joint
space narrowing or erosion. There is a small inferior calcaneal
spur. Ankle mortise appears intact.
IMPRESSION: No acute fracture. Small focus of calcification dorsal to the talus
may represent residua of old trauma. Ankle mortise appears intact.
No joint space narrowing. Small inferior calcaneal spur noted.

## 2021-04-04 ENCOUNTER — Emergency Department (HOSPITAL_COMMUNITY)
Admission: EM | Admit: 2021-04-04 | Discharge: 2021-04-04 | Disposition: A | Discharge status: Home / Self Care | Payer: Commercial Managed Care - PPO | Attending: Emergency Medicine | Admitting: Emergency Medicine

## 2021-04-04 ENCOUNTER — Other Ambulatory Visit: Payer: Self-pay

## 2021-04-04 ENCOUNTER — Encounter (HOSPITAL_COMMUNITY): Payer: Self-pay | Admitting: *Deleted

## 2021-04-04 ENCOUNTER — Emergency Department (HOSPITAL_COMMUNITY): Payer: Commercial Managed Care - PPO

## 2021-04-04 DIAGNOSIS — S93492A Sprain of other ligament of left ankle, initial encounter: Secondary | ICD-10-CM

## 2021-04-04 DIAGNOSIS — F17210 Nicotine dependence, cigarettes, uncomplicated: Secondary | ICD-10-CM | POA: Diagnosis not present

## 2021-04-04 DIAGNOSIS — W109XXA Fall (on) (from) unspecified stairs and steps, initial encounter: Secondary | ICD-10-CM | POA: Insufficient documentation

## 2021-04-04 DIAGNOSIS — Y9301 Activity, walking, marching and hiking: Secondary | ICD-10-CM | POA: Insufficient documentation

## 2021-04-04 DIAGNOSIS — S99912A Unspecified injury of left ankle, initial encounter: Secondary | ICD-10-CM | POA: Diagnosis present

## 2021-04-04 MED ORDER — CYCLOBENZAPRINE HCL 10 MG PO TABS: 10.0000 mg | ORAL_TABLET | Freq: Two times a day (BID) | ORAL | 0 refills | Status: DC | PRN

## 2021-04-04 MED ORDER — IBUPROFEN 800 MG PO TABS
800.0000 mg | ORAL_TABLET | Freq: Once | ORAL | Status: AC
  Administered 2021-04-04: 800 mg via ORAL
  Filled 2021-04-04: qty 1

## 2021-04-04 MED ORDER — IBUPROFEN 600 MG PO TABS: 600.0000 mg | ORAL_TABLET | Freq: Four times a day (QID) | ORAL | 0 refills | Status: DC | PRN

## 2021-04-04 NOTE — ED Provider Notes (Signed)
[facility]-EMERGENCY DEPT Provider Note   CSN: [id number] Arrival date & time: 04/04/21  8756     History Chief Complaint  Patient presents with   Ankle Pain    Meredith Cortez is a 30 y.o. female.  The history is provided by the patient. No language interpreter was used.  Ankle Pain Associated symptoms: no fever    30 year old female who presents complaining of left ankle injury.  Patient states last night she was walking down the steps but missed the last step, twisted her left ankle and fell.  She denies hitting her head or loss of consciousness.  She reported cute onset of sharp throbbing pain about the left ankle radiates towards her lower leg worse with ambulation.  Pain is moderate in intensity mildly improved with using ice, Biofreeze, and Tylenol.  No numbness no knee pain or hip pain.  No prior injury to the same ankle.  History reviewed. No pertinent past medical history.  There are no problems to display for this patient.   History reviewed. No pertinent surgical history.   OB History   No obstetric history on file.     Family History  Problem Relation Age of Onset   Alcoholism Father     Social History   Tobacco Use   Smoking status: Every Day    Packs/day: 0.50    Years: 5.00    Pack years: 2.50    Types: Cigarettes, E-cigarettes   Smokeless tobacco: Never  Vaping Use   Vaping Use: Some days  Substance Use Topics   Alcohol use: Yes    Comment: 2-3 x/week   Drug use: Never    Home Medications Prior to Admission medications   Medication Sig Start Date End Date Taking? Authorizing Provider  metoCLOPramide (REGLAN) 10 MG/10ML SOLN Take 5 mLs (5 mg total) by mouth 3 (three) times daily before meals. 05/09/19   Johnna Acosta, NP  predniSONE (DELTASONE) 10 MG tablet Use per dose pack 12/29/19   Scarboro, Coralee North, NP  triamcinolone cream (KENALOG) 0.1 % Apply 1 application topically 2 (two) times daily. 12/29/19   Johnna Acosta,  NP    Allergies    Patient has no known allergies.  Review of Systems   Review of Systems  Constitutional:  Negative for fever.  Musculoskeletal:  Positive for arthralgias.  Neurological:  Negative for numbness.   Physical Exam Updated Vital Signs BP (!) 132/95 (BP Location: Left Arm)   Pulse 87   Temp 98 F (36.7 C) (Oral)   Resp 16   LMP 03/21/2021   SpO2 100%   Physical Exam Vitals and nursing note reviewed.  Constitutional:      General: She is not in acute distress.    Appearance: She is well-developed.  HENT:     Head: Atraumatic.  Eyes:     Conjunctiva/sclera: Conjunctivae normal.  Pulmonary:     Effort: Pulmonary effort is normal.  Musculoskeletal:        General: Signs of injury (Left ankle: Moderate edema noted to the lateral malleoli region at the ATFL with tenderness to palpation but no crepitus.  Decreased ankle range of motion secondary to pain.  Intact dorsalis pedis pulse.  No tenderness to fifth metatarsal) present.     Cervical back: Neck supple.     Comments: Left knee and left hip nontender.  No tenderness to left calf  Skin:    Findings: No rash.  Neurological:     Mental Status:  She is alert.  Psychiatric:        Mood and Affect: Mood normal.    ED Results / Procedures / Treatments   Labs (all labs ordered are listed, but only abnormal results are displayed) Labs Reviewed - No data to display  EKG None  Radiology DG Ankle Complete Left  Result Date: 04/04/2021 CLINICAL DATA:  Larey Seat. Left ankle pain. EXAM: LEFT ANKLE COMPLETE - 3+ VIEW COMPARISON:  None. FINDINGS: The ankle mortise is maintained. No acute ankle fracture. No osteochondral lesion. No joint effusion. The mid and hindfoot bony structures are intact and the joint spaces are maintained. Moderate-sized calcaneal heel spur is noted. IMPRESSION: No acute bony findings. Electronically Signed   By: Rudie Meyer M.D.   On: 04/04/2021 09:54    Procedures Procedures   Medications  Ordered in ED Medications  ibuprofen (ADVIL) tablet 800 mg (has no administration in time range)    ED Course  I have reviewed the triage vital signs and the nursing notes.  Pertinent labs & imaging results that were available during my care of the patient were reviewed by me and considered in my medical decision making (see chart for details).    MDM Rules/Calculators/A&P                           BP (!) 132/95 (BP Location: Left Arm)   Pulse 87   Temp 98 F (36.7 C) (Oral)   Resp 16   LMP 03/21/2021   SpO2 100%   Final Clinical Impression(s) / ED Diagnoses Final diagnoses:  Sprain of anterior talofibular ligament of left ankle, initial encounter    Rx / DC Orders ED Discharge Orders          Ordered    ibuprofen (ADVIL) 600 MG tablet  Every 6 hours PRN        04/04/21 1017    cyclobenzaprine (FLEXERIL) 10 MG tablet  2 times daily PRN        04/04/21 1017           10:11 AM Patient presents with left ankle injury from a fall.  Finding consistent with an ankle sprain.  X-ray negative for fracture.  Will provide ASO for stability, RICE therapy discussed.  Orthopedic referral given as needed.  Work note provided.   Fayrene Helper, PA-C 04/04/21 1018    Benjiman Core, MD 04/04/21 [phone]

## 2021-04-04 NOTE — ED Triage Notes (Signed)
Pt complains of left ankle pain since falling down steps last night. No other injury. No loss of consciousness.

## 2021-04-04 NOTE — Progress Notes (Signed)
Orthopedic Tech Progress Note Patient Details:  Meredith Cortez July 01, 1991 [id number]  Ortho Devices Type of Ortho Device: ASO Ortho Device/Splint Location: left Ortho Device/Splint Interventions: Application   Post Interventions Patient Tolerated: Well Instructions Provided: Care of device, Adjustment of device  Saul Fordyce 04/04/2021, 10:43 AM

## 2022-01-01 DIAGNOSIS — H1033 Unspecified acute conjunctivitis, bilateral: Secondary | ICD-10-CM | POA: Insufficient documentation

## 2022-01-07 ENCOUNTER — Ambulatory Visit (HOSPITAL_COMMUNITY)
Admission: EM | Admit: 2022-01-07 | Discharge: 2022-01-07 | Disposition: A | Discharge status: Home / Self Care | Payer: Commercial Managed Care - PPO | Attending: Emergency Medicine | Admitting: Emergency Medicine

## 2022-01-07 ENCOUNTER — Other Ambulatory Visit: Payer: Self-pay

## 2022-01-07 ENCOUNTER — Encounter (HOSPITAL_COMMUNITY): Payer: Self-pay | Admitting: *Deleted

## 2022-01-07 DIAGNOSIS — H1033 Unspecified acute conjunctivitis, bilateral: Secondary | ICD-10-CM | POA: Diagnosis not present

## 2022-01-07 MED ORDER — FLUORESCEIN SODIUM 1 MG OP STRP
ORAL_STRIP | OPHTHALMIC | Status: AC
  Filled 2022-01-07: qty 1

## 2022-01-07 MED ORDER — GENTAMICIN SULFATE 0.3 % OP SOLN: 2.0000 [drp] | Freq: Three times a day (TID) | OPHTHALMIC | 0 refills | Status: AC

## 2022-01-07 MED ORDER — TETRACAINE HCL 0.5 % OP SOLN
OPHTHALMIC | Status: AC
  Filled 2022-01-07: qty 4

## 2022-01-07 NOTE — ED Triage Notes (Signed)
Had a virtual visit one week ago and was given eye drops . The eye drops have not helped the eye irritation. ?

## 2022-01-07 NOTE — ED Provider Notes (Signed)
?[facility] ? ? ? ?CSN: [id number] ?Arrival date & time: 01/07/22  1413 ? ? ?  ? ?History   ?Chief Complaint ?Chief Complaint  ?Patient presents with  ? Eye Problem  ? ? ?HPI ?Meredith Cortez is a 31 y.o. female.  ? ?She presents with two week history of eye redness, pain, and drainage. She was seen by video visit one week ago for the same and was prescribed polymyxin drops. After one week of using these as prescribed she had no improvement in symptoms. Today she began having blurry vision. She had purulent drainage from both eyes and crusting. She feels there is "grit" in her eyes. Her eye pain began giving her a headache. She has no known trauma to the eye. She does not wear contact lenses. Denies congestion, fever, sore throat, rash.  ? ? ?History reviewed. No pertinent past medical history. ? ?There are no problems to display for this patient. ? ?History reviewed. No pertinent surgical history. ? ?OB History   ?No obstetric history on file. ?  ? ?Home Medications   ? ?Prior to Admission medications   ?Medication Sig Start Date End Date Taking? Authorizing Provider  ?gentamicin (GARAMYCIN) 0.3 % ophthalmic solution Place 2 drops into both eyes 3 (three) times daily for 7 days. 01/07/22 01/14/22 Yes Azayla Polo, Wells Guiles, PA-C  ?cyclobenzaprine (FLEXERIL) 10 MG tablet Take 1 tablet (10 mg total) by mouth 2 (two) times daily as needed for muscle spasms. 04/04/21   Domenic Moras, PA-C  ?ibuprofen (ADVIL) 600 MG tablet Take 1 tablet (600 mg total) by mouth every 6 (six) hours as needed for moderate pain. 04/04/21   Domenic Moras, PA-C  ?metoCLOPramide (REGLAN) 10 MG/10ML SOLN Take 5 mLs (5 mg total) by mouth 3 (three) times daily before meals. 05/09/19   Kendell Bane, NP  ?predniSONE (DELTASONE) 10 MG tablet Use per dose pack 12/29/19   Scarboro, Audie Clear, NP  ?triamcinolone cream (KENALOG) 0.1 % Apply 1 application topically 2 (two) times daily. 12/29/19   Kendell Bane, NP  ? ? ?Family History ?Family History  ?Problem  Relation Age of Onset  ? Alcoholism Father   ? ? ?Social History ?Social History  ? ?Tobacco Use  ? Smoking status: Every Day  ?  Packs/day: 0.50  ?  Years: 5.00  ?  Pack years: 2.50  ?  Types: Cigarettes, E-cigarettes  ? Smokeless tobacco: Never  ?Vaping Use  ? Vaping Use: Some days  ?Substance Use Topics  ? Alcohol use: Yes  ?  Comment: 2-3 x/week  ? Drug use: Never  ? ?Allergies   ?Patient has no known allergies. ? ?Review of Systems ?Review of Systems  ?Constitutional:  Negative for fever.  ?HENT: Negative.    ?Eyes:  Positive for pain, discharge and redness.  ?Respiratory: Negative.    ?Cardiovascular: Negative.   ?Gastrointestinal: Negative.   ?Genitourinary: Negative.   ?Musculoskeletal: Negative.   ?Neurological: Negative.   ?All other systems reviewed and are negative. ? ?Physical Exam ?Triage Vital Signs ?ED Triage Vitals  ?Enc Vitals Group  ?   BP 01/07/22 1536 130/87  ?   Pulse Rate 01/07/22 1536 68  ?   Resp 01/07/22 1536 18  ?   Temp 01/07/22 1536 98.7 ?F (37.1 ?C)  ?   Temp src --   ?   SpO2 01/07/22 1536 97 %  ?   Weight --   ?   Height --   ?   Head Circumference --   ?  Peak Flow --   ?   Pain Score 01/07/22 1533 8  ?   Pain Loc --   ?   Pain Edu? --   ?   Excl. in [place]? --   ? ?No data found. ? ?Updated Vital Signs ?BP 130/87   Pulse 68   Temp 98.7 ?F (37.1 ?C)   Resp 18   LMP 01/01/2022   SpO2 97%  ? ? ?Physical Exam ?Eyes:  ?   General: Lids are normal.     ?   Right eye: Discharge present.     ?   Left eye: Discharge present. ?   Extraocular Movements: Extraocular movements intact.  ?   Conjunctiva/sclera:  ?   Right eye: Right conjunctiva is injected.  ?   Left eye: Left conjunctiva is injected.  ?   Pupils: Pupils are equal, round, and reactive to light.  ?   Comments: No corneal ulcer or abrasion noted on woods lamp evaluation ?Minimal discharge matted in lower eyelashes  ? ? ?UC Treatments / Results  ?Labs ?(all labs ordered are listed, but only abnormal results are displayed) ?Labs  Reviewed - No data to display ? ?EKG ? ?Radiology ?No results found. ? ?Procedures ?Procedures (including critical care time) ? ?Medications Ordered in UC ?Medications - No data to display ? ?Initial Impression / Assessment and Plan / UC Course  ?I have reviewed the triage vital signs and the nursing notes. ? ?Pertinent labs & imaging results that were available during my care of the patient were reviewed by me and considered in my medical decision making (see chart for details). ? ?Patient presenting with symptoms and exam consistent with bilateral conjunctivitis. Woods lamp evaluation ruled out corneal injury. Prescribed gentamycin drops for 7 days. We discussed to contact an eye doctor if symptoms do not improve with this new antibiotic. Contact information for eye doctor given. Discussed return precautions and patient agrees to plan. Discharged in stable condition. ? ?Final Clinical Impressions(s) / UC Diagnoses  ? ?Final diagnoses:  ?Acute conjunctivitis of both eyes, unspecified acute conjunctivitis type  ? ? ? ?Discharge Instructions   ? ?  ?Place two drops into each eye, three times a day, for a total of 7 days ? ?If no improvement, please call to make an appointment with an eye doctor. ? ?If symptoms worsen, please come back to urgent care or go to the emergency department.  ? ? ?ED Prescriptions   ? ? Medication Sig Dispense Auth. Provider  ? gentamicin (GARAMYCIN) 0.3 % ophthalmic solution Place 2 drops into both eyes 3 (three) times daily for 7 days. 5 mL Ilo Beamon, Wells Guiles, PA-C  ? ?  ? ?PDMP not reviewed this encounter. ?  ?Corbet Hanley, Wells Guiles, PA-C ?01/07/22 1912 ? ?

## 2022-01-07 NOTE — Discharge Instructions (Addendum)
Place two drops into each eye, three times a day, for a total of 7 days ? ?If no improvement, please call to make an appointment with an eye doctor. ? ?If symptoms worsen, please come back to urgent care or go to the emergency department.  ?

## 2022-02-18 ENCOUNTER — Telehealth: Payer: Commercial Managed Care - PPO | Admitting: Emergency Medicine

## 2022-02-18 DIAGNOSIS — H1013 Acute atopic conjunctivitis, bilateral: Secondary | ICD-10-CM | POA: Diagnosis not present

## 2022-02-18 MED ORDER — CROMOLYN SODIUM 4 % OP SOLN
1.0000 [drp] | Freq: Four times a day (QID) | OPHTHALMIC | 12 refills | Status: AC
Start: 2022-02-18 — End: ?

## 2022-02-18 NOTE — Progress Notes (Signed)
Virtual Visit Consent   Meredith Cortez, you are scheduled for a virtual visit with a [facility] provider today. Just as with appointments in the office, your consent must be obtained to participate. Your consent will be active for this visit and any virtual visit you may have with one of our providers in the next 365 days. If you have a MyChart account, a copy of this consent can be sent to you electronically.  As this is a virtual visit, video technology does not allow for your provider to perform a traditional examination. This may limit your provider's ability to fully assess your condition. If your provider identifies any concerns that need to be evaluated in person or the need to arrange testing (such as labs, EKG, etc.), we will make arrangements to do so. Although advances in technology are sophisticated, we cannot ensure that it will always work on either your end or our end. If the connection with a video visit is poor, the visit may have to be switched to a telephone visit. With either a video or telephone visit, we are not always able to ensure that we have a secure connection.  By engaging in this virtual visit, you consent to the provision of healthcare and authorize for your insurance to be billed (if applicable) for the services provided during this visit. Depending on your insurance coverage, you may receive a charge related to this service.  I need to obtain your verbal consent now. Are you willing to proceed with your visit today? Meredith Cortez has provided verbal consent on 02/18/2022 for a virtual visit (video or telephone). Carvel Getting, NP  Date: 02/18/2022 1:42 PM  Virtual Visit via Video Note   I, Carvel Getting, connected with  Meredith Cortez  ([id number], 1991-04-17) on 02/18/22 at  1:00 PM EDT by a video-enabled telemedicine application and verified that I am speaking with the correct person using two identifiers.  Location: Patient: Virtual Visit Location Patient:  Home Provider: Virtual Visit Location Provider: Home Office   I discussed the limitations of evaluation and management by telemedicine and the availability of in person appointments. The patient expressed understanding and agreed to proceed.    History of Present Illness: Meredith Cortez is a 31 y.o. who identifies as a female who was assigned female at birth, and is being seen today for pinkeye.  Patient reports she developed red eyes with drainage in April.  She had a video visit at that time and was prescribed Polytrim eyedrops.  After 5 days of use, she realized that they were not helping at all, and she went to urgent care where she was given a prescription for gentamicin eyedrops.  Symptoms did improve with gentamicin eyedrops and that the redness in her eyes was resolved.  However, even with the gentamicin eyedrops, her eyes continued to feel gritty and she continued to have a mucoid drainage every day.  Symptoms have been worse in the last 3 days as her eyes have become red again.  She typically does not have seasonal allergies and does not have any other symptoms of allergies such as runny nose.  Her eyes do feel itchy sometimes.  She does use warm compresses to help remove crusted drainage when she wakes up in the morning.  She does not wear contacts or use eye make-up.  No one else in her household including her girlfriend have similar symptoms.  She is the only 1 with the symptoms.  She denies fever or chills  or any other symptoms.  Symptoms have always been in both eyes; 1 eye has never been worse than the other.  HPI: HPI  Problems: There are no problems to display for this patient.   Allergies: No Known Allergies Medications:  Current Outpatient Medications:    cromolyn (OPTICROM) 4 % ophthalmic solution, Place 1 drop into the left eye 4 (four) times daily., Disp: 10 mL, Rfl: 12  Observations/Objective: Patient is well-developed, well-nourished in no acute distress.  Resting  comfortably  at home.  Head is normocephalic, atraumatic.  No labored breathing.  Speech is clear and coherent with logical content.  Patient is alert and oriented at baseline.  Sclera are mildly injected bilaterally.  I am not able to see any discharge on video.  Assessment and Plan: 1. Allergic conjunctivitis of both eyes  Prescribed cromolyn eyedrops.  Recommended artificial tears.  Recommended using Zyrtec or Claritin.  Patient to follow-up with an eye doctor if symptoms do not resolve.  Follow Up Instructions: I discussed the assessment and treatment plan with the patient. The patient was provided an opportunity to ask questions and all were answered. The patient agreed with the plan and demonstrated an understanding of the instructions.  A copy of instructions were sent to the patient via MyChart unless otherwise noted below.   The patient was advised to call back or seek an in-person evaluation if the symptoms worsen or if the condition fails to improve as anticipated.  Time:  I spent 15 minutes with the patient via telehealth technology discussing the above problems/concerns.    Carvel Getting, NP

## 2022-02-18 NOTE — Patient Instructions (Addendum)
  Meredith Cortez, thank you for joining Cathlyn Parsons, NP for today's virtual visit.  While this provider is not your primary care provider (PCP), if your PCP is located in our provider database this encounter information will be shared with them immediately following your visit.  Consent: (Patient) Meredith Cortez provided verbal consent for this virtual visit at the beginning of the encounter.  Current Medications:  Current Outpatient Medications:    cromolyn (OPTICROM) 4 % ophthalmic solution, Place 1 drop into the left eye 4 (four) times daily., Disp: 10 mL, Rfl: 12   Medications ordered in this encounter:  Meds ordered this encounter  Medications   cromolyn (OPTICROM) 4 % ophthalmic solution    Sig: Place 1 drop into the left eye 4 (four) times daily.    Dispense:  10 mL    Refill:  12     *If you need refills on other medications prior to your next appointment, please contact your pharmacy*  Follow-Up: Call back or seek an in-person evaluation if the symptoms worsen or if the condition fails to improve as anticipated.  Other Instructions I was able to prescribe only 1 medicine for your allergic eye symptoms - you do not need to prescription eyedrops at this time.  Try also taking medicine for allergies such as Zyrtec or Claritin to help manage your symptoms.   Use warm compresses using a clean washcloth each time to help soothe your eyes and remove drainage.  Purchase artificial tears to use to help soothe your eyes in between use of the prescription eyedrops.  Do not use the artificial tears at the same time as prescription eyedrops as the artificial tears will just wash the medicine out of your eye.  If this does not resolve your eye symptoms, you will need to be seen by an eye doctor such as an optometrist or ophthalmologist.   If you have been instructed to have an in-person evaluation today at a local Urgent Care facility, please use the link below. It will take you to a  list of all of our available [facility], including address, phone number and hours of operation. Please do not delay care.  [facility]  If you or a family member do not have a primary care provider, use the link below to schedule a visit and establish care. When you choose a [facility] primary care physician or advanced practice provider, you gain a long-term partner in health. Find a Primary Care Provider  Learn more about [facility]'s in-office and virtual care options: [facility] - Get Care Now

## 2022-02-23 IMAGING — CR DG ANKLE COMPLETE 3+V*L*
3 series · 3 of 3 positions shown · non-contrast
Comparison: None.

CLINICAL DATA: Fell. Left ankle pain.

EXAM:
LEFT ANKLE COMPLETE - 3+ VIEW

[x ankle ap left]
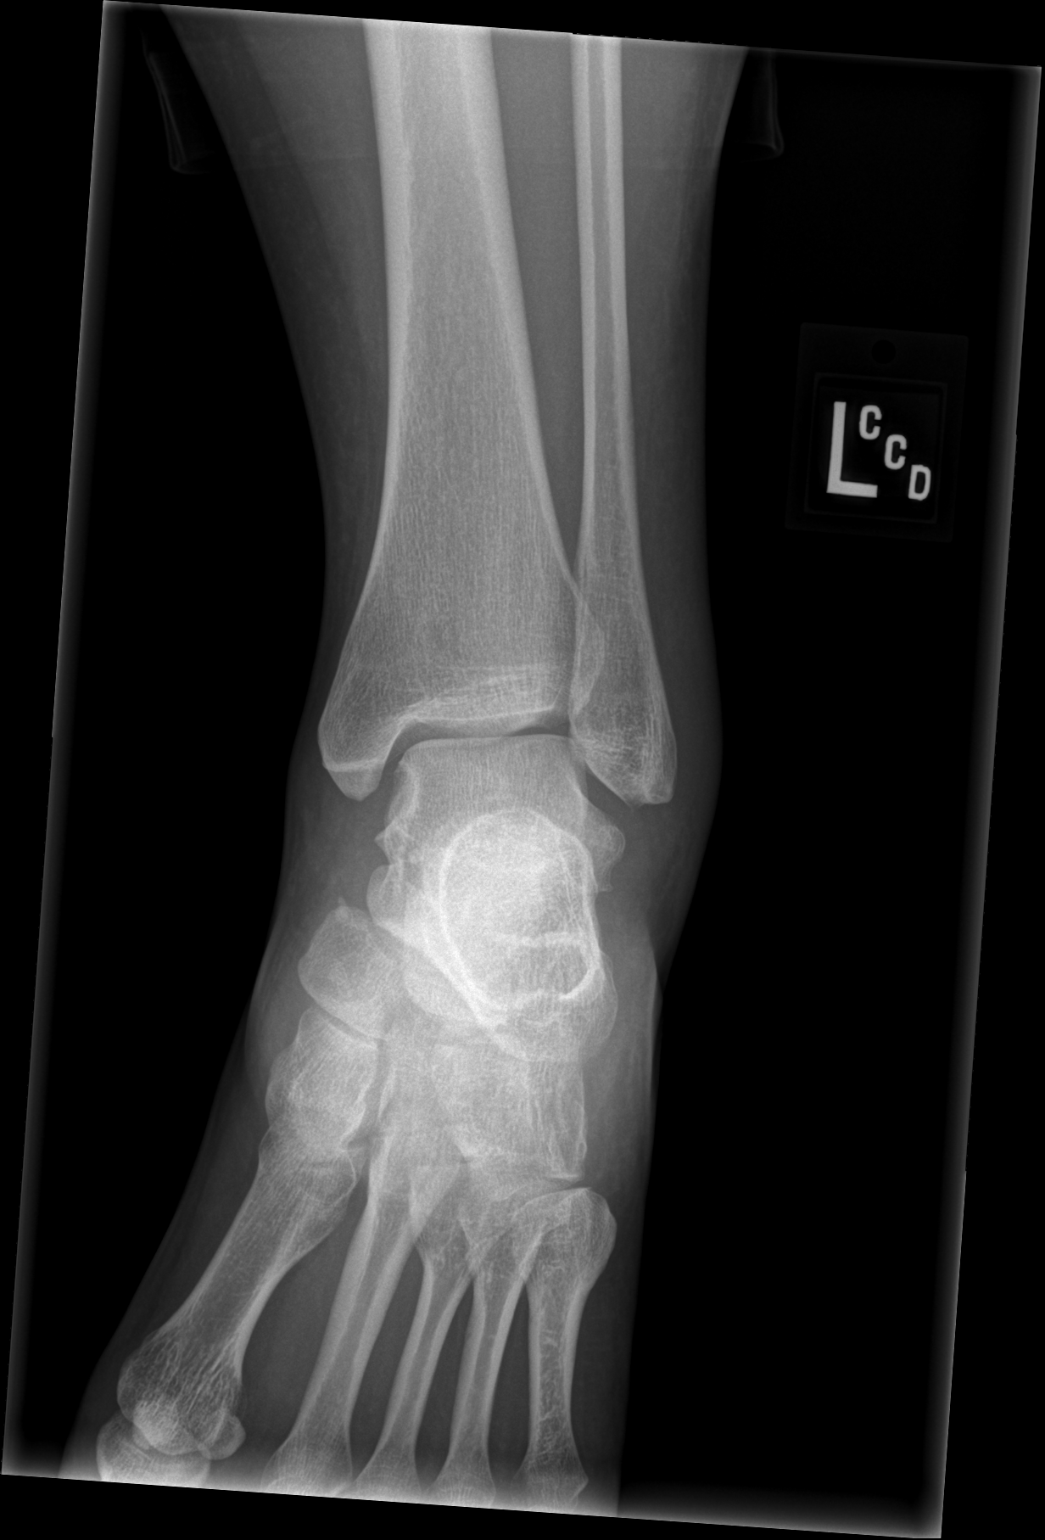

[x ankle obl left]
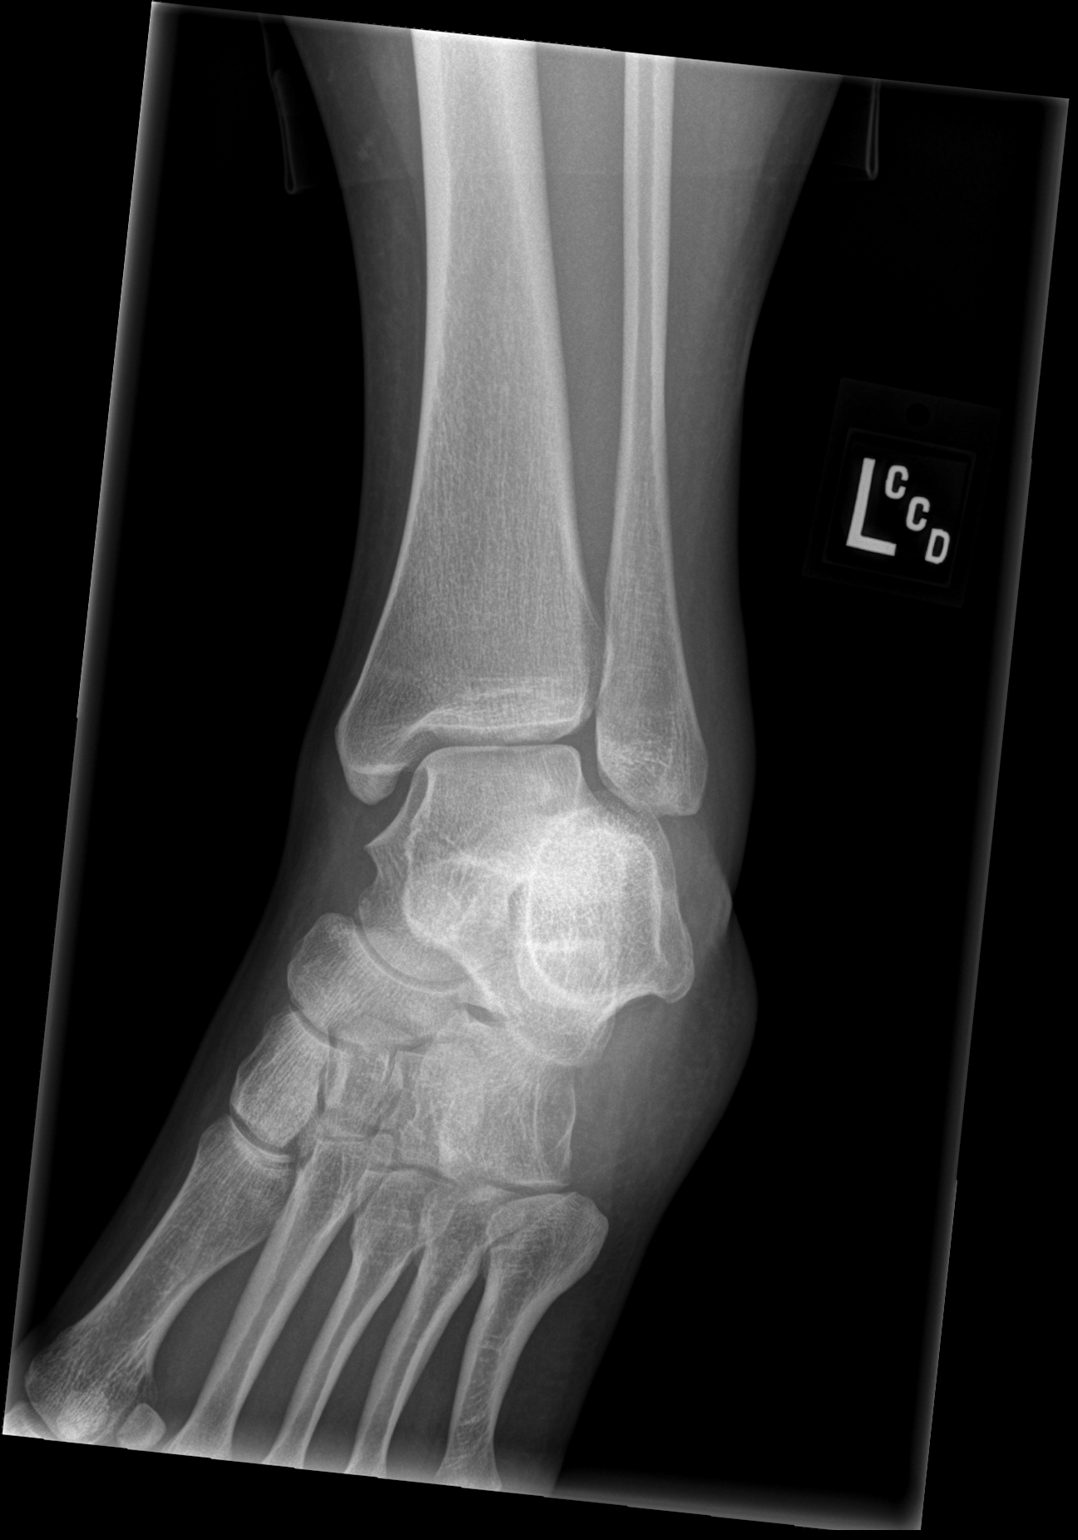

[x ankle lat left]
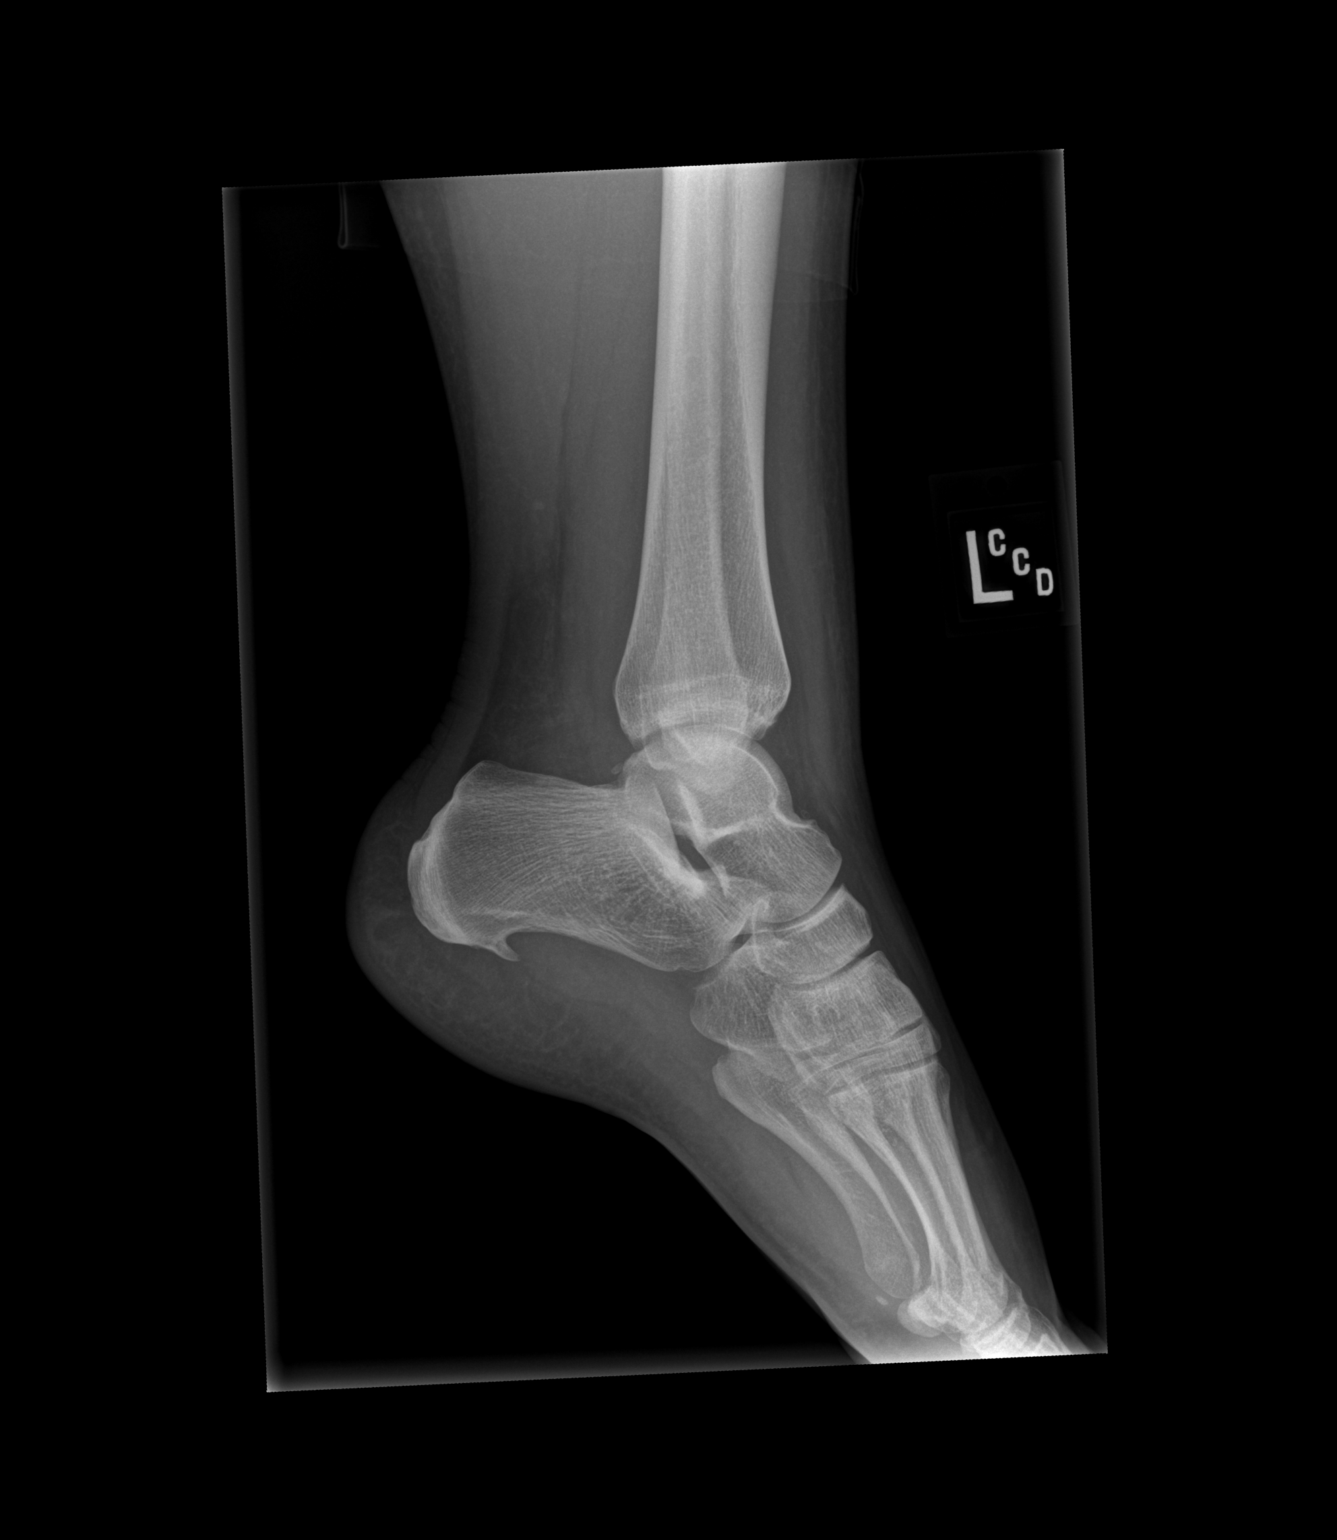

[3 of 3 positions shown; findings below may reference images not displayed]

FINDINGS: The ankle mortise is maintained. No acute ankle fracture. No
osteochondral lesion. No joint effusion.

The mid and hindfoot bony structures are intact and the joint spaces
are maintained.

Moderate-sized calcaneal heel spur is noted.
IMPRESSION: No acute bony findings.

## 2022-03-20 ENCOUNTER — Emergency Department (HOSPITAL_COMMUNITY)
Admission: EM | Admit: 2022-03-20 | Discharge: 2022-03-21 | Disposition: A | Discharge status: Home / Self Care | Payer: Commercial Managed Care - PPO | Source: Home / Self Care | Attending: Emergency Medicine | Admitting: Emergency Medicine

## 2022-03-20 ENCOUNTER — Encounter (HOSPITAL_COMMUNITY): Payer: Self-pay

## 2022-03-20 ENCOUNTER — Emergency Department (HOSPITAL_COMMUNITY)
Admission: EM | Admit: 2022-03-20 | Discharge: 2022-03-20 | Discharge status: Against Medical Advice | Payer: Commercial Managed Care - PPO | Attending: Emergency Medicine | Admitting: Emergency Medicine

## 2022-03-20 ENCOUNTER — Other Ambulatory Visit: Payer: Self-pay

## 2022-03-20 DIAGNOSIS — F17210 Nicotine dependence, cigarettes, uncomplicated: Secondary | ICD-10-CM | POA: Insufficient documentation

## 2022-03-20 DIAGNOSIS — H1013 Acute atopic conjunctivitis, bilateral: Secondary | ICD-10-CM | POA: Insufficient documentation

## 2022-03-20 DIAGNOSIS — R519 Headache, unspecified: Secondary | ICD-10-CM | POA: Diagnosis not present

## 2022-03-20 DIAGNOSIS — H538 Other visual disturbances: Secondary | ICD-10-CM | POA: Diagnosis present

## 2022-03-20 DIAGNOSIS — Z5321 Procedure and treatment not carried out due to patient leaving prior to being seen by health care provider: Secondary | ICD-10-CM | POA: Insufficient documentation

## 2022-03-20 NOTE — ED Triage Notes (Signed)
Pt presents to ED from home. States her eyes have been swollen, irritated, and red since April. Pt states she has been worked up for multiple causes and has taken abx for treatment but problem has not resolved. Pt endorses headache.

## 2022-03-20 NOTE — ED Triage Notes (Signed)
Patients eyes have been red/inflamed since April. She went to the eye doctor, they keep placing her on different eye drops and antibiotics. Her eyes are crusted over when she wakes up. Continuous headache. Patients vision is starting to get blurry.

## 2022-03-21 ENCOUNTER — Telehealth: Payer: Self-pay | Admitting: Adult Health

## 2022-03-21 MED ORDER — FLUORESCEIN SODIUM 1 MG OP STRP
1.0000 | ORAL_STRIP | Freq: Once | OPHTHALMIC | Status: AC
Start: 2022-03-21 — End: 2022-03-21
  Administered 2022-03-21: 1 via OPHTHALMIC
  Filled 2022-03-21: qty 1

## 2022-03-21 MED ORDER — OLOPATADINE HCL 0.2 % OP SOLN: 1.0000 [drp] | Freq: Every day | OPHTHALMIC | 0 refills | Status: AC

## 2022-03-21 MED ORDER — TETRACAINE HCL 0.5 % OP SOLN
2.0000 [drp] | Freq: Once | OPHTHALMIC | Status: AC
Start: 2022-03-21 — End: 2022-03-21
  Administered 2022-03-21: 2 [drp] via OPHTHALMIC
  Filled 2022-03-21: qty 4

## 2022-03-21 NOTE — Discharge Instructions (Addendum)
You were evaluated in the Emergency Department and after careful evaluation, we did not find any emergent condition requiring admission or further testing in the hospital.  Your exam/testing today is overall reassuring.  Recommend continued follow-up with an ophthalmologist.  Use the eyedrops as directed to try to treat for allergic conjunctivitis.  Please return to the Emergency Department if you experience any worsening of your condition.   Thank you for allowing [place] to be a part of your care.

## 2022-03-21 NOTE — Telephone Encounter (Signed)
Ins removed from visit - Estimate created and provided verbally to patient.

## 2022-03-21 NOTE — Telephone Encounter (Signed)
FYI-- Patient called to confirm new patient appointment information. States that she is not sure if she is still covered under [employer] due to losing her job last week. I informed patient she should call [employer] first to see how long she would be covered and then call us back for the next steps. Pt verbalized understanding and appreciated the advice. I told patient that when she calls back she may talk to me directly if she chooses.

## 2022-03-21 NOTE — ED Provider Notes (Signed)
[facility]-EMERGENCY DEPT [facility] Emergency Department Provider Note MRN:  [id number]  Arrival date & time: 03/21/22     Chief Complaint   Eye Problem   History of Present Illness   Meredith Cortez is a 31 y.o. year-old female with no pertinent past medical presenting to the ED with chief complaint of eye problem.  Bilateral redness to the eyes with drainage and crusting for the past 3 months.  Has been on multiple antibiotic eyedrops without help.  Does not know what else to do.  Saw an ophthalmologist last week who had her start a new antibiotic medicine, which is not helping.  Mild blurred vision, otherwise no complaints.  No joint pain, no recent infections, no issues with urinating, no rash anywhere else.  Review of Systems  A thorough review of systems was obtained and all systems are negative except as noted in the HPI and PMH.   Patient's Health History   History reviewed. No pertinent past medical history.  History reviewed. No pertinent surgical history.  Family History  Problem Relation Age of Onset   Alcoholism Father     Social History   Socioeconomic History   Marital status: Single    Spouse name: Not on file   Number of children: Not on file   Years of education: Not on file   Highest education level: Not on file  Occupational History   Not on file  Tobacco Use   Smoking status: Every Day    Packs/day: 0.50    Years: 5.00    Total pack years: 2.50    Types: Cigarettes, E-cigarettes   Smokeless tobacco: Never  Vaping Use   Vaping Use: Some days  Substance and Sexual Activity   Alcohol use: Yes    Comment: 2-3 x/week   Drug use: Never   Sexual activity: Not on file  Other Topics Concern   Not on file  Social History Narrative   Not on file   Social Determinants of Health   Financial Resource Strain: Not on file  Food Insecurity: Not on file  Transportation Needs: Not on file  Physical Activity: Not on file  Stress: Not on file  Social  Connections: Not on file  Intimate Partner Violence: Not on file     Physical Exam   Vitals:   03/21/22 0230 03/21/22 0255  BP: 119/80 (!) 111/94  Pulse: 69 73  Resp:  16  Temp:    SpO2: 98% 100%    CONSTITUTIONAL: Well-appearing, NAD NEURO/PSYCH:  Alert and oriented x 3, no focal deficits EYES:  eyes equal and reactive ENT/NECK:  no LAD, no JVD CARDIO: Regular rate, well-perfused, normal S1 and S2 PULM:  CTAB no wheezing or rhonchi GI/GU:  non-distended, non-tender MSK/SPINE:  No gross deformities, no edema SKIN:  no rash, atraumatic   *Additional and/or pertinent findings included in MDM below  Diagnostic and Interventional Summary    EKG Interpretation  Date/Time:    Ventricular Rate:    PR Interval:    QRS Duration:   QT Interval:    QTC Calculation:   R Axis:     Text Interpretation:         Labs Reviewed - No data to display  No orders to display    Medications  fluorescein ophthalmic strip 1 strip (1 strip Both Eyes Given 03/21/22 0239)  tetracaine (PONTOCAINE) 0.5 % ophthalmic solution 2 drop (2 drops Both Eyes Given 03/21/22 0239)     Procedures  /  Critical  Care Procedures  ED Course and Medical Decision Making  Initial Impression and Ddx Chronic conjunctivitis bilaterally, serous drainage.  No concomitant symptoms.  Considering allergic conjunctivitis.  Normal extraocular movements, normal pupillary response, Woods lamp exam largely reassuring.  Given the chronicity there is no emergent process, visual acuity largely intact, normal vital signs, will advise ophthalmology follow-up.  Past medical/surgical history that increases complexity of ED encounter: None  Interpretation of Diagnostics Laboratory and/or imaging options to aid in the diagnosis/care of the patient were considered.  After careful history and physical examination, it was determined that there was no indication for diagnostics at this time.  Patient Reassessment and Ultimate  Disposition/Management     Discharge  Patient management required discussion with the following services or consulting groups:  None  Complexity of Problems Addressed Acute illness or injury that poses threat of life of bodily function  Additional Data Reviewed and Analyzed Further history obtained from: Further history from spouse/family member  Additional Factors Impacting ED Encounter Risk Prescriptions  Elmer Sow. Pilar Plate, MD [facility] Emergency Medicine [facility] [email or web address]  Final Clinical Impressions(s) / ED Diagnoses     ICD-10-CM   1. Allergic conjunctivitis of both eyes  H10.13       ED Discharge Orders          Ordered    Olopatadine HCl (PATADAY) 0.2 % SOLN  Daily        03/21/22 0308             Discharge Instructions Discussed with and Provided to Patient:    Discharge Instructions      You were evaluated in the Emergency Department and after careful evaluation, we did not find any emergent condition requiring admission or further testing in the hospital.  Your exam/testing today is overall reassuring.  Recommend continued follow-up with an ophthalmologist.  Use the eyedrops as directed to try to treat for allergic conjunctivitis.  Please return to the Emergency Department if you experience any worsening of your condition.   Thank you for allowing [place] to be a part of your care.      Sabas Sous, MD 03/21/22 [phone]

## 2022-03-25 ENCOUNTER — Encounter: Payer: Self-pay | Admitting: Family

## 2022-03-25 ENCOUNTER — Ambulatory Visit (INDEPENDENT_AMBULATORY_CARE_PROVIDER_SITE_OTHER): Payer: Self-pay | Admitting: Family

## 2022-03-25 VITALS — BP 117/81 | HR 76 | Temp 98.0°F | Ht 62.0 in | Wt 174.4 lb

## 2022-03-25 DIAGNOSIS — F4323 Adjustment disorder with mixed anxiety and depressed mood: Secondary | ICD-10-CM | POA: Diagnosis not present

## 2022-03-25 DIAGNOSIS — H5789 Other specified disorders of eye and adnexa: Secondary | ICD-10-CM | POA: Diagnosis not present

## 2022-03-25 NOTE — Progress Notes (Signed)
New Patient Office Visit  Subjective:  Patient ID: Meredith Cortez, female    DOB: 16-Jun-1991  Age: 31 y.o. MRN: [id number]  CC:  Chief Complaint  Patient presents with   Establish Care   Eye Drainage    Pt c/o  both eyes having redness, discharge, Painful(burning). Pt states she is has seen multiple doctors for this situation and eye drops are not helping. She does see an eye doctor tomorrow. Light sensitivity and headaches, pt states she has been taking eye numbing drops. Pt is not able to drive/ see   Depression    Discuss medications    HPI Meredith Cortez presents for establishing care today.  Eye irritation:  started several months ago, both eyes started with burning, watering, stringy discharge, was given antihistamine eye drops and treated for chlamydia with 1 Azithromycin, and then DOXY x 1 week, finished about 3 weeks ago, but did not make a difference, prescribed by ophthalmologist, pt reports nasal congestion and sinus pressure. Has tried Claritin OTC without relief. Has been seen by online and UC provider and then recently ER visit.  Anxiety/Depression: mostly situational due to current eye problem, recently lost her job and is without medical insurance, both parents have medical problems. Pt feels overwhelmed right now. Denies previous medications or counseling.     03/25/2022    3:30 PM  Depression screen PHQ 2/9  Decreased Interest 0  Down, Depressed, Hopeless 3  PHQ - 2 Score 3  Altered sleeping 3  Tired, decreased energy 3  Change in appetite 2  Feeling bad or failure about yourself  2  Trouble concentrating 3  Moving slowly or fidgety/restless 3  Suicidal thoughts 0  PHQ-9 Score 19  Difficult doing work/chores Extremely dIfficult   Assessment & Plan:   Problem List Items Addressed This Visit       Other   Situational mixed anxiety and depressive disorder    new pt recently lost job, both parents ill, suffering with allergy sx with no relief, no medical  insurance pt with GF today who is very supportive pt to f/u after getting current allergy sx under control      Other Visit Diagnoses     Irritation of both eyes    -  Primary very watery, seen by several providers, tried different eye drops, abt x 2, no change in sx, has appt with [facility] tomorrow, will check allergy panel today.    Relevant Orders   Allergen Panel (27) + [facility] (Completed)   Allergy Panel 11, [facility] (Completed)      Subjective:    Outpatient Medications Prior to Visit  Medication Sig Dispense Refill   Olopatadine HCl (PATADAY) 0.2 % SOLN Apply 1 drop to eye daily. 2.5 mL 0   cromolyn (OPTICROM) 4 % ophthalmic solution Place 1 drop into the left eye 4 (four) times daily. (Patient not taking: Reported on 03/25/2022) 10 mL 12   No facility-administered medications prior to visit.   History reviewed. No pertinent past medical history. History reviewed. No pertinent surgical history.  Objective:   Today's Vitals: BP 117/81 (BP Location: Left Arm, Patient Position: Sitting, Cuff Size: Large)   Pulse 76   Temp 98 F (36.7 C) (Temporal)   Ht 5\' 2"  (1.575 m)   Wt 174 lb 6 oz (79.1 kg)   LMP 02/26/2022 (Exact Date)   SpO2 97%   BMI 31.89 kg/m   Physical Exam Vitals and nursing note reviewed.  Constitutional:  Appearance: Normal appearance.  HENT:     Right Ear: Tympanic membrane and ear canal normal.     Left Ear: Ear canal normal. No tenderness. Tympanic membrane is injected. Tympanic membrane is not erythematous or bulging.     Nose:     Right Sinus: Frontal sinus tenderness present.     Left Sinus: Frontal sinus tenderness present.     Mouth/Throat:     Pharynx: Oropharyngeal exudate and posterior oropharyngeal erythema (mild) present. No pharyngeal swelling.  Eyes:     General: Allergic shiner and visual field deficit present.        Right eye: Discharge (watery) present.        Left eye: Discharge (watery) present.     Conjunctiva/sclera:     Right eye: Right conjunctiva is injected. No hemorrhage.    Left eye: Left conjunctiva is injected. No hemorrhage. Cardiovascular:     Rate and Rhythm: Normal rate and regular rhythm.  Pulmonary:     Effort: Pulmonary effort is normal.     Breath sounds: Normal breath sounds.  Musculoskeletal:        General: Normal range of motion.  Skin:    General: Skin is warm and dry.  Neurological:     Mental Status: She is alert.  Psychiatric:        Mood and Affect: Mood normal.        Behavior: Behavior normal.     Dulce Sellar, NP

## 2022-03-25 NOTE — Patient Instructions (Addendum)
Welcome to [facility], It was a pleasure meeting you today.   I will review your lab results via MyChart in a few days.  Keep your eye doctor appointment tomorrow and try to write down the exact chronology of events since your eye symptoms started back in April, including that you were using over the counter Clear eye drops daily for a long time. It will help the doctor understand exactly what all you have tried and when. The more information the better.  After you see him, call me or send a message thru MyChart as to his plan for you.  I also want you to follow up regarding your left ear, call if the pain is worsening. 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 Do not go swimming in a lake or pool, do not let shower water soak in your ear, shake out water or can use a wax ear plug during shower.      PLEASE NOTE: If you had any LAB tests please let us know if you have not heard back within a few days. You may see your results on MyChart before we have a chance to review them but we will give you a call once they are reviewed by [place]. If we ordered any REFERRALS today, please let us know if you have not heard from their office within the next week.  Let us know through MyChart if you are needing REFILLS, or have your pharmacy send [place] the request. You can also use MyChart to communicate with me or any office staff.

## 2022-03-26 LAB — ALLERGY PANEL 11, MOLD GROUP
Allergen, A. alternata, m6: 0.48 kU/L — ABNORMAL HIGH
Allergen, Mucor Racemosus, M4: 1.47 kU/L — ABNORMAL HIGH
Aspergillus fumigatus, m3: 0.32 kU/L — ABNORMAL HIGH
CLADOSPORIUM HERBARUM (M2) IGE: 0.42 kU/L — ABNORMAL HIGH
CLASS: 1
CLASS: 2
Candida Albicans: 0.52 kU/L — ABNORMAL HIGH
Class: 1
Class: 1

## 2022-03-26 LAB — INTERPRETATION:

## 2022-03-27 LAB — ALLERGEN PANEL (27) + IGE
Alternaria Alternata IgE: 0.22 kU/L — AB
Aspergillus Fumigatus IgE: 0.32 kU/L — AB
Bahia Grass IgE: 63.4 kU/L — AB
Bermuda Grass IgE: 33.5 kU/L — AB
Cat Dander IgE: 20.8 kU/L — AB
Cedar, Mountain IgE: 10.6 kU/L — AB
Cladosporium Herbarum IgE: 0.39 kU/L — AB
Cocklebur IgE: 16.9 kU/L — AB
Cockroach, American IgE: 4.22 kU/L — AB
Common Silver Birch IgE: 16.9 kU/L — AB
D Farinae IgE: 2.58 kU/L — AB
D Pteronyssinus IgE: 0.73 kU/L — AB
Dog Dander IgE: 5.5 kU/L — AB
Elm, American IgE: 20.8 kU/L — AB
Hickory, White IgE: 72.6 kU/L — AB
IgE (Immunoglobulin E), Serum: 2420 IU/mL — ABNORMAL HIGH (ref 6–495)
Johnson Grass IgE: 54.8 kU/L — AB
Kentucky Bluegrass IgE: >100 kU/L — AB
Maple/Box Elder IgE: 13.6 kU/L — AB
Mucor Racemosus IgE: 1.55 kU/L — AB
Oak, White IgE: 13.6 kU/L — AB
Penicillium Chrysogen IgE: 0.21 kU/L — AB
Pigweed, Rough IgE: 20.7 kU/L — AB
Plantain, English IgE: 14.8 kU/L — AB
Ragweed, Short IgE: 12 kU/L — AB
Setomelanomma Rostrat: 0.17 kU/L — AB
Timothy Grass IgE: >100 kU/L — AB
White Mulberry IgE: 11.5 kU/L — AB

## 2022-03-28 ENCOUNTER — Other Ambulatory Visit: Payer: Self-pay | Admitting: Family

## 2022-03-28 DIAGNOSIS — Z9109 Other allergy status, other than to drugs and biological substances: Secondary | ICD-10-CM

## 2022-03-28 MED ORDER — LEVOCETIRIZINE DIHYDROCHLORIDE 5 MG PO TABS: 5.0000 mg | ORAL_TABLET | Freq: Every evening | ORAL | 5 refills | Status: AC

## 2022-03-28 MED ORDER — FLUTICASONE PROPIONATE 50 MCG/ACT NA SUSP: NASAL | 1 refills | Status: AC

## 2022-03-28 NOTE — Progress Notes (Signed)
Hi Alainah,  You are highly allergic to many things as you can see!  I am sending a referral to our allergy office, but I am also sending over Flonase nasal spray, use this 1 squirt each nostril twice a day for 1 week, then decrease to 1 time per day. I also am sending over generic Xyzal to take daily.  Start these right away and let the allergist know if they are helping or not.  Also you need to avoid cats and dogs, outside pollens on high alert days, and have your home checked for mold. The allergist can better assist with either allergy shots or desensitization to some of the allergens.  Start the above meds for now to help your symptoms. Let me know how it went with with the eye doctor.  Take care -

## 2022-03-31 DIAGNOSIS — F4323 Adjustment disorder with mixed anxiety and depressed mood: Secondary | ICD-10-CM | POA: Insufficient documentation

## 2022-03-31 NOTE — Assessment & Plan Note (Signed)
   new  pt recently lost job, both parents ill, suffering with allergy sx with no relief, no medical insurance  pt with GF today who is very supportive  pt to f/u after getting current allergy sx under control

## 2022-06-03 ENCOUNTER — Encounter: Payer: Self-pay | Admitting: *Deleted

## 2022-08-22 ENCOUNTER — Encounter: Payer: Self-pay | Admitting: *Deleted
